# Patient Record
Sex: Male | Born: 1960 | Race: White | Hispanic: No | Marital: Married | Smoking: Never smoker
Health system: Southern US, Community
[De-identification: ages and names within clinical notes are randomized; demographics above are authoritative.]

## PROBLEM LIST (undated history)

## (undated) DIAGNOSIS — I10 Essential (primary) hypertension: Secondary | ICD-10-CM

## (undated) DIAGNOSIS — K219 Gastro-esophageal reflux disease without esophagitis: Secondary | ICD-10-CM

---

## 2005-06-08 ENCOUNTER — Observation Stay (HOSPITAL_COMMUNITY): Admission: EM | Admit: 2005-06-08 | Discharge: 2005-06-09 | Payer: Self-pay | Admitting: Emergency Medicine

## 2005-06-09 ENCOUNTER — Encounter (INDEPENDENT_AMBULATORY_CARE_PROVIDER_SITE_OTHER): Payer: Self-pay | Admitting: Cardiology

## 2008-02-19 ENCOUNTER — Emergency Department (HOSPITAL_COMMUNITY): Admission: EM | Admit: 2008-02-19 | Discharge: 2008-02-19 | Payer: Self-pay | Admitting: Emergency Medicine

## 2010-09-25 NOTE — H&P (Signed)
NAME:  MURRIEL, EIDEM NO.:  [id number]   MEDICAL RECORD NO.:  [id number]          PATIENT TYPE:  EMS   LOCATION:  MAJO                         FACILITY:  [facility]   PHYSICIAN:  Lonia Blood, M.D.      DATE OF BIRTH:  04/12/1961   DATE OF ADMISSION:  06/08/2005  DATE OF DISCHARGE:                                HISTORY & PHYSICAL   PRIMARY CARE PHYSICIAN:  [facility]   PRESENTING COMPLAINT:  Retrosternal chest pain.   HISTORY OF PRESENT ILLNESS:  Patient is a 50 year old gentleman with history  of GERD and dyslipidemia who apparently started having retrosternal and left-  sided chest pain last night at 9:00.  Pain was said to be severe, up to 8-10  and radiating towards his left and right shoulders.  The chest pain is worse  when he lies down flat and gets better when he leans forward.  He denied any  diaphoresis, nausea, or vomiting.  Patient has been battling severe reflux  and has had sore throat from that.  He was initiated on Nexium last November  which he takes daily for his GERD.  He has described his pain as not  consistent with his previous GERD feelings.  He has changed his diet  recently to low fat diet secondary to his high cholesterol and he also takes  green tea as part of the dietary changes.  Otherwise, denied any prior chest  pain and is not getting worse with exertion.   PAST MEDICAL HISTORY:  Significant only for GERD which is severe and  dyslipidemia.   ALLERGIES:  Patient has no known drug allergies.   MEDICATIONS:  Nexium 40 mg daily.   SOCIAL HISTORY:  Patient is married with four children, most of them grown  up.  He denied any tobacco use.  He drinks occasionally.  This past Saturday  he drank seven beers, but only drinks socially.  No IV drug use.   FAMILY HISTORY:  His father died from complications of Parkinson's disease.  Mother is alive but she has had mild stroke.  She also has some borderline  diabetes and  hypertension.  Patient's aunt died from acute MI in her 37s.  His uncle also had multiple cardiac surgeries including CABG.  His children,  however, are healthy except for multiple fractures from playing sports.   REVIEW OF SYSTEMS:  A 10-point review of systems is performed and is  essentially as in HPI.   PHYSICAL EXAMINATION:  VITAL SIGNS:  Temperature 98, blood pressure 181/105  initially, currently down to 153/97.  His pulse is 82.  Respiratory rate is  20.  Saturations 97% on room air.  GENERAL:  Patient is pleasant, alert and oriented, in no acute distress.  HEENT:  PERRL.  EOMI.  NECK:  Supple.  No JVD.  No lymphadenopathy.  RESPIRATORY:  Patient has good air entry bilaterally.  CARDIOVASCULAR:  He has regular rate and rhythm.  ABDOMEN:  Soft, nontender with positive bowel sounds.  Obese.  EXTREMITIES:  No edema, cyanosis, or clubbing.   LABORATORIES:  His current laboratories include initial cardiac enzymes that  are negative.  Other laboratory work is still pending.  His EKG showed  normal sinus rhythm with a rate of 80, normal intervals, normal axis, no ST-  T wave changes.  Chest x-ray essentially negative for acute disease.  Chest  CT without contrast is also performed that shows negative for pulmonary  embolus.  Also negative for aortic dissection.   ASSESSMENT:  This is a 50 year old gentleman with mainly history of severe  gastroesophageal reflux disease presenting with retrosternal chest pain.  Patient has risk factors for cardiac disease including high cholesterol,  current hypertension, although patient has no baseline history of  hypertension, being a male and then some family history.  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  The differentials have  already been ruled out for the most part.  We, however, need to rule out  cardiac disease.  My impression is that  his chest pain is more likely to be  secondary to his severe gastroesophageal reflux disease, especially since it  gets worse when he lies down flat and patient said he has been having sore  throat which is attributed to his gastroesophageal reflux disease.  He is  taking Nexium but he said he been having attacks and he has to take extra  Zantac.  He might require PPIs twice a day maybe to control his symptoms.  In the meantime, however, we need to rule out pericarditis through 2-D  echocardiogram and also continue to check serial cardiac enzymes.  If they  are all negative patient may still require some stress test.  1.  Severe gastroesophageal reflux disease.  Patient has had      gastroesophageal reflux disease for more than 20 years.  He had an EGD      about 15-20 years ago and since then has not had any.  He would benefit      from another EGD.  I will refer patient to Dr. Loreta Ave and Dr. Elnoria Howard so he      could get that done.  2.  Morbid obesity.  Patient is currently trying on his own to lose weight.      I will encourage him to do that.  3.  Dyslipidemia.  Will check fasting lipid panel.  4.  Hypertension.  His blood pressure is currently elevated in addition to      the chest pain, therefore, I will start him on some beta blocker.  I      will start him on some Lopressor.  Give him some aspirin, low dose.      Because of his gastroesophageal reflux disease I will give him enteric-      coated 81 mg.  I will also give him some nitroglycerin also sublingual      and observe patient overnight.  5.  Prophylaxis.  Patient is already going to be on PPI b.i.d. and will add      Lovenox for deep venous thrombosis prophylaxis.  If his enzymes start      rising then we will change that to full dose Lovenox.      Lonia Blood, M.D.  Electronically Signed     LG/MEDQ  D:  06/08/2005  T:  06/08/2005  Job:  [id number]

## 2010-09-25 NOTE — Discharge Summary (Signed)
NAME:  RENTON, BERKLEY NO.:  [id number]   MEDICAL RECORD NO.:  [id number]          PATIENT TYPE:  OBV   LOCATION:  4706                         FACILITY:  [facility]   PHYSICIAN:  Lonia Blood, M.D.      DATE OF BIRTH:  08-Dec-1960   DATE OF ADMISSION:  06/08/2005  DATE OF DISCHARGE:  06/09/2005                                 DISCHARGE SUMMARY   PRIMARY CARE PHYSICIAN:  [facility].   DISCHARGE DIAGNOSES:  1.  Atypical chest pain.  2.  Severe gastroesophageal reflux disease.  3.  Obesity.  4.  Dyslipidemia.  5.  High blood pressure with no prior history of hypertension.   DISCHARGE MEDICATIONS:  1.  Nexium 40 mg b.i.d.  2.  Toprol XL 25 mg daily.   DISPOSITION:  The patient was to follow up with Dr. Loreta Ave one week after  discharge, as well as the [facility] about a week later.  He also had an appointment to follow up February 2, at [facility] for Cardiol[facility].  His appointment was at 9:45 a.m.   PROCEDURES:  Chest x-ray performed on June 08, 2005.  Chest x-ray shows  no acute disease.  Chest CT follow up was negative for PE, negative for  aortic dissection also.  CT angiography of the abdomen also showed no aortic  dissection.  A 2D echocardiogram on June 08, 2005, showed an EF of 60-70%  and virtually normal.   CONSULTATIONS:  [facility].   HISTORY OF PRESENT ILLNESS:  The patient is a 50 year old gentleman with  history of severe reflux disease for more than 20 years who recently started  PPI after staying off medications for a long time.  He came in secondary to  severe left sided chest pain.  It was worse when he was lying flat.  The  patient said it is also worse when he takes in a deep breath.  In the ED, he  was found to be hypertensive with blood pressure as high as 180 systolic.  He was alert and non-hypertensive.  He had history of dyslipidemia as well  as  obesity, but denied any tobacco use.  He has risk factors for heart  disease in his family.  We subsequently admitted him for rule out MI.   HOSPITAL COURSE:  #1 - CHEST PAIN.  The patient's chest pain actually  disappeared with combination of increased PPI, nitroglycerin, Lopressor.  Will also check a 2D echo as indicated above which is negative.  It appeared  as if his chest pain was more likely secondary to reflux.  However, due to  his risk factors, we got Cardiology involved.  With negative enzymes, he was  scheduled for outpatient exercise stress test.   #2 - HYPERTENSION.  This seems to be new diagnosis for the patient.  We  subsequently started him on Lopressor, and he was discharged home on Toprol  XL.  His blood pressure as well controlled afterwards.   #3 - DYSLIPIDEMIA.  The patient gave history of  dyslipidemia.  However, he  denied taking any medications prior to arrival in the hospital.  His lipid  panel here was basically within normal limits.  We did not start him on any  medication as well.  Subsequently, he was discharged.  Continue with follow  up as an outpatient.   #4 - OBESITY.  The patient was also counseled on need for him to loose  weight for both his GERD as well as his heart.  Subsequently, he was  discharged and to follow up with Dr. Loreta Ave for his GERD.  His PPI dose was  increased to twice a day rather than once a day.      Lonia Blood, M.D.  Electronically Signed     LG/MEDQ  D:  09/21/2005  T:  09/22/2005  Job:  [id number]

## 2011-02-08 LAB — DIFFERENTIAL
Eosinophils Absolute: 0.1
Eosinophils Relative: 2
Lymphocytes Relative: 35
Lymphs Abs: 2.5
Monocytes Relative: 13 — ABNORMAL HIGH
Neutro Abs: 3.6

## 2011-02-08 LAB — POCT I-STAT, CHEM 8
BUN: 12
Creatinine, Ser: 1.3
Glucose, Bld: 94
HCT: 46
Hemoglobin: 15.6
TCO2: 30

## 2011-02-08 LAB — CBC
Hemoglobin: 15.8
MCHC: 34.7
Platelets: 234
RBC: 4.88
WBC: 7.2

## 2011-02-08 LAB — PROTIME-INR
INR: 1
Prothrombin Time: 13

## 2015-11-20 DIAGNOSIS — I10 Essential (primary) hypertension: Secondary | ICD-10-CM | POA: Insufficient documentation

## 2015-11-20 DIAGNOSIS — G4700 Insomnia, unspecified: Secondary | ICD-10-CM | POA: Insufficient documentation

## 2015-11-20 DIAGNOSIS — K219 Gastro-esophageal reflux disease without esophagitis: Secondary | ICD-10-CM | POA: Insufficient documentation

## 2015-11-20 DIAGNOSIS — E785 Hyperlipidemia, unspecified: Secondary | ICD-10-CM | POA: Insufficient documentation

## 2019-03-27 ENCOUNTER — Emergency Department (HOSPITAL_COMMUNITY): Payer: BC Managed Care – PPO

## 2019-03-27 ENCOUNTER — Emergency Department (HOSPITAL_COMMUNITY)
Admission: EM | Admit: 2019-03-27 | Discharge: 2019-03-28 | Disposition: A | Discharge status: Home / Self Care | Payer: BC Managed Care – PPO | Attending: Emergency Medicine | Admitting: Emergency Medicine

## 2019-03-27 ENCOUNTER — Other Ambulatory Visit: Payer: Self-pay

## 2019-03-27 ENCOUNTER — Encounter (HOSPITAL_COMMUNITY): Payer: Self-pay

## 2019-03-27 DIAGNOSIS — R0789 Other chest pain: Secondary | ICD-10-CM | POA: Insufficient documentation

## 2019-03-27 DIAGNOSIS — R079 Chest pain, unspecified: Secondary | ICD-10-CM

## 2019-03-27 DIAGNOSIS — E876 Hypokalemia: Secondary | ICD-10-CM | POA: Diagnosis not present

## 2019-03-27 DIAGNOSIS — R202 Paresthesia of skin: Secondary | ICD-10-CM | POA: Insufficient documentation

## 2019-03-27 DIAGNOSIS — R200 Anesthesia of skin: Secondary | ICD-10-CM | POA: Diagnosis not present

## 2019-03-27 DIAGNOSIS — I10 Essential (primary) hypertension: Secondary | ICD-10-CM | POA: Insufficient documentation

## 2019-03-27 HISTORY — DX: Essential (primary) hypertension: I10

## 2019-03-27 HISTORY — DX: Gastro-esophageal reflux disease without esophagitis: K21.9

## 2019-03-27 LAB — COMPREHENSIVE METABOLIC PANEL
ALT: 29 U/L (ref 0–44)
AST: 23 U/L (ref 15–41)
Albumin: 3.7 g/dL (ref 3.5–5.0)
Alkaline Phosphatase: 68 U/L (ref 38–126)
Anion gap: 7 (ref 5–15)
BUN: 15 mg/dL (ref 6–20)
CO2: 26 mmol/L (ref 22–32)
Calcium: 8.7 mg/dL — ABNORMAL LOW (ref 8.9–10.3)
Chloride: 105 mmol/L (ref 98–111)
Creatinine, Ser: 1.24 mg/dL (ref 0.61–1.24)
GFR calc Af Amer: >60 mL/min (ref >60)
GFR calc non Af Amer: >60 mL/min (ref >60)
Glucose, Bld: 152 mg/dL — ABNORMAL HIGH (ref 70–99)
Potassium: 3.2 mmol/L — ABNORMAL LOW (ref 3.5–5.1)
Sodium: 138 mmol/L (ref 135–145)
Total Bilirubin: 0.4 mg/dL (ref 0.3–1.2)
Total Protein: 6.8 g/dL (ref 6.5–8.1)

## 2019-03-27 LAB — DIFFERENTIAL
Abs Immature Granulocytes: 0.02 10*3/uL (ref 0.00–0.07)
Basophils Absolute: 0 10*3/uL (ref 0.0–0.1)
Basophils Relative: 1 %
Eosinophils Absolute: 0.2 10*3/uL (ref 0.0–0.5)
Eosinophils Relative: 2 %
Immature Granulocytes: 0 %
Lymphocytes Relative: 34 %
Lymphs Abs: 2.7 10*3/uL (ref 0.7–4.0)
Monocytes Absolute: 1 10*3/uL (ref 0.1–1.0)
Monocytes Relative: 12 %
Neutro Abs: 4 10*3/uL (ref 1.7–7.7)
Neutrophils Relative %: 51 %

## 2019-03-27 LAB — APTT: aPTT: 31 seconds (ref 24–36)

## 2019-03-27 LAB — CBC
HCT: 42.4 % (ref 39.0–52.0)
Hemoglobin: 14.7 g/dL (ref 13.0–17.0)
MCH: 31.8 pg (ref 26.0–34.0)
MCHC: 34.7 g/dL (ref 30.0–36.0)
MCV: 91.8 fL (ref 80.0–100.0)
Platelets: 209 10*3/uL (ref 150–400)
RBC: 4.62 MIL/uL (ref 4.22–5.81)
RDW: 12.5 % (ref 11.5–15.5)
WBC: 7.8 10*3/uL (ref 4.0–10.5)
nRBC: 0 % (ref 0.0–0.2)

## 2019-03-27 LAB — PROTIME-INR
INR: 1 (ref 0.8–1.2)
Prothrombin Time: 13.5 seconds (ref 11.4–15.2)

## 2019-03-27 NOTE — ED Triage Notes (Signed)
Pt reports left sided chest pain that started about 1 hr ago, pain has improved since when it started. Pt also c.o left sided facial numbness that radiates halfway down his arm. Pt denies any weakness, no neuro deficits noted. Pt a.o, nad noted.

## 2019-03-28 ENCOUNTER — Encounter (HOSPITAL_COMMUNITY): Payer: Self-pay | Admitting: Emergency Medicine

## 2019-03-28 LAB — TROPONIN I (HIGH SENSITIVITY)
Troponin I (High Sensitivity): 2 ng/L (ref <18)
Troponin I (High Sensitivity): 3 ng/L (ref <18)

## 2019-03-28 MED ORDER — POTASSIUM CHLORIDE CRYS ER 20 MEQ PO TBCR: 40.0000 meq | EXTENDED_RELEASE_TABLET | Freq: Every day | ORAL | 0 refills | Status: DC

## 2019-03-28 MED ORDER — POTASSIUM CHLORIDE CRYS ER 20 MEQ PO TBCR
40.0000 meq | EXTENDED_RELEASE_TABLET | Freq: Once | ORAL | Status: AC
  Administered 2019-03-28: 40 meq via ORAL
  Filled 2019-03-28: qty 2

## 2019-03-28 MED ORDER — ACETAMINOPHEN 325 MG PO TABS
650.0000 mg | ORAL_TABLET | Freq: Once | ORAL | Status: AC | PRN
  Administered 2019-03-28: 650 mg via ORAL
  Filled 2019-03-28: qty 2

## 2019-03-28 NOTE — ED Notes (Signed)
ED Provider at bedside. 

## 2019-03-28 NOTE — ED Notes (Signed)
Pt says that his blood pressure spiked and he took his blood pressure, said that he was having some chest pain and numbness that has since subsided. He is most concerned at this time about his blood pressure. Alert and oriented, no neuro deficits.

## 2019-03-28 NOTE — ED Provider Notes (Signed)
[facility] EMERGENCY DEPARTMENT Provider Note   CSN: [id number] Arrival date & time: 03/27/19  2056     History   Chief Complaint Chief Complaint  Patient presents with   Chest Pain   Numbness    HPI Ruben Simmons is a 58 y.o. male.     Patient with history of HTN presents with onset of left sided chest and neck pain about 1 hours prior to arrival in the ED. No SOB, cough, fever, diaphoresis. He denies history of ischemia. No nausea or vomiting. No modifying factors. The discomfort has been constant since arrival. He reports his chest discomfort extends to the left neck and face and that his face felt somewhat numb earlier. The numbness has now resolved. No congestion, sore throat or ear pain.   The history is provided by the patient. No language interpreter was used.  Chest Pain Associated symptoms: numbness (See HPI.)   Associated symptoms: no abdominal pain, no cough, no diaphoresis, no fever, no nausea and no shortness of breath     Past Medical History:  Diagnosis Date   GERD (gastroesophageal reflux disease)    Hypertension     There are no active problems to display for this patient.   History reviewed. No pertinent surgical history.      Home Medications    Prior to Admission medications   Not on File    Family History No family history on file.  Social History Social History   Tobacco Use   Smoking status: Not on file  Substance Use Topics   Alcohol use: Not on file   Drug use: Not on file     Allergies   Penicillins   Review of Systems Review of Systems  Constitutional: Negative for chills, diaphoresis and fever.  HENT: Negative.  Negative for congestion, ear pain, facial swelling and sore throat.   Respiratory: Negative.  Negative for cough and shortness of breath.   Cardiovascular: Positive for chest pain.  Gastrointestinal: Negative.  Negative for abdominal pain and nausea.  Musculoskeletal: Negative.     Skin: Negative.   Neurological: Positive for numbness (See HPI.).     Physical Exam Updated Vital Signs BP (!) 132/97 (BP Location: Left Arm)    Pulse (!) 54    Temp 97.8 F (36.6 C) (Oral)    Resp 16    Ht 5\' 11"  (1.803 m)    Wt 105.2 kg    SpO2 98%    BMI 32.36 kg/m   Physical Exam Vitals signs and nursing note reviewed.  Constitutional:      Appearance: He is well-developed.  HENT:     Head: Normocephalic.  Neck:     Musculoskeletal: Normal range of motion and neck supple.     Vascular: No carotid bruit.  Cardiovascular:     Rate and Rhythm: Normal rate and regular rhythm.     Heart sounds: No murmur.  Pulmonary:     Effort: Pulmonary effort is normal.     Breath sounds: Normal breath sounds. No wheezing, rhonchi or rales.  Chest:     Chest wall: No tenderness.  Abdominal:     General: Bowel sounds are normal.     Palpations: Abdomen is soft.     Tenderness: There is no abdominal tenderness. There is no guarding or rebound.  Musculoskeletal: Normal range of motion.  Skin:    General: Skin is warm and dry.     Findings: No rash.  Neurological:  General: No focal deficit present.     Mental Status: He is alert and oriented to person, place, and time.     Sensory: No sensory deficit.     Motor: No weakness.     Coordination: Coordination normal.     Gait: Gait normal.      ED Treatments / Results  Labs (all labs ordered are listed, but only abnormal results are displayed) Labs Reviewed  COMPREHENSIVE METABOLIC PANEL - Abnormal; Notable for the following components:      Result Value   Potassium 3.2 (*)    Glucose, Bld 152 (*)    Calcium 8.7 (*)    All other components within normal limits  PROTIME-INR  APTT  CBC  DIFFERENTIAL  TROPONIN I (HIGH SENSITIVITY)  TROPONIN I (HIGH SENSITIVITY)    EKG EKG Interpretation  Date/Time:  Tuesday March 27 2019 21:23:52 EST Ventricular Rate:  69 PR Interval:  154 QRS Duration: 106 QT  Interval:  386 QTC Calculation: 413 R Axis:   29 Text Interpretation: Normal sinus rhythm Incomplete right bundle branch block Borderline ECG No significant change was found Confirmed by Drema Pryardama, Pedro [phone]([id number]) on 03/28/2019 4:36:20 AM   Radiology Dg Chest 2 View  Result Date: 03/27/2019 CLINICAL DATA:  Chest pain. EXAM: CHEST - 2 VIEW COMPARISON:  Chest radiograph 02/20/2008 FINDINGS: Heart size at the upper limits of normal. Mild ill-defined opacities of bilateral lung bases have an appearance most consistent with atelectasis. No evidence of pneumothorax or sizable pleural effusion. No acute bony abnormality. IMPRESSION: Heart size at the upper limits of normal. Mild ill-defined opacities at the bilateral lung bases with an appearance most consistent with atelectasis. Electronically Signed   By: Jackey LogeKyle  Golden DO   On: 03/27/2019 21:53   Ct Head Wo Contrast  Result Date: 03/27/2019 CLINICAL DATA:  Focal neuro deficit, greater than 6 hours, stroke suspected. Additional provided: Patient reports numbness and tingling on left side of face for 1.5 hours. Chest pain. EXAM: CT HEAD WITHOUT CONTRAST TECHNIQUE: Contiguous axial images were obtained from the base of the skull through the vertex without intravenous contrast. COMPARISON:  Head CT 02/20/2008 FINDINGS: Brain: No evidence of acute intracranial hemorrhage. No acute demarcated cortical infarction. 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 No evidence of intracranial mass. No midline shift or extra-axial fluid collection. Cerebral volume is normal for age. Vascular: No hyperdense vessel Skull: Normal. Negative for fracture or focal lesion. Sinuses/Orbits: Visualized orbits demonstrate no acute abnormality. No significant paranasal sinus disease or mastoid effusion at the imaged levels. IMPRESSION: 1. No CT evidence of acute intracranial abnormality. 2. 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 Electronically Signed   By: Jackey LogeKyle  Golden DO   On: 03/27/2019 21:59    Procedures Procedures (including critical care time)  Medications Ordered in ED Medications  acetaminophen (TYLENOL) tablet 650 mg (650 mg Oral Given 03/28/19 0035)     Initial Impression / Assessment and Plan / ED Course  I have reviewed the triage vital signs and the nursing notes.  Pertinent labs & imaging results that were available during my care of the patient were reviewed by me and considered in my medical decision making (see chart for details).        Patient to ED with left chest pain extending to left neck and face, including brief episode of facial numbness.   Chest pain is  atypical in that it has been constant. Trop x 2 negative, nonischemic EKG, clear CXR, no significant lab abnormalities with the exception of a mild hypokalemia, which will be supplemented.   He is felt appropriate for discharge home and is encouraged to follow up with PCP for recheck.   Final Clinical Impressions(s) / ED Diagnoses   Final diagnoses:  None   1. Nonspecific chest pain 2. Hypokalemia  ED Discharge Orders    None       Elpidio Anis, PA-C 03/28/19 [id number]    Nira Conn, MD 03/28/19 [phone]

## 2019-03-28 NOTE — Discharge Instructions (Addendum)
Please follow up with your doctor to let them know of your symptoms and evaluation in the emergency department tonight. Return to the ED with any severe pain, severe shortness of breath, high fever, or new concerns.  Take potassium supplements once daily for an additional 2 days.

## 2020-10-13 DIAGNOSIS — U071 COVID-19: Secondary | ICD-10-CM | POA: Diagnosis not present

## 2020-10-14 DIAGNOSIS — U071 COVID-19: Secondary | ICD-10-CM | POA: Diagnosis not present

## 2020-11-04 DIAGNOSIS — R739 Hyperglycemia, unspecified: Secondary | ICD-10-CM | POA: Diagnosis not present

## 2020-11-04 DIAGNOSIS — K146 Glossodynia: Secondary | ICD-10-CM | POA: Diagnosis not present

## 2020-11-04 DIAGNOSIS — Z125 Encounter for screening for malignant neoplasm of prostate: Secondary | ICD-10-CM | POA: Diagnosis not present

## 2020-11-04 DIAGNOSIS — I10 Essential (primary) hypertension: Secondary | ICD-10-CM | POA: Diagnosis not present

## 2020-11-14 ENCOUNTER — Encounter (HOSPITAL_COMMUNITY): Payer: Self-pay

## 2020-11-14 ENCOUNTER — Emergency Department (HOSPITAL_COMMUNITY)
Admission: EM | Admit: 2020-11-14 | Discharge: 2020-11-14 | Discharge status: Against Medical Advice | Payer: BC Managed Care – PPO | Attending: Emergency Medicine | Admitting: Emergency Medicine

## 2020-11-14 ENCOUNTER — Other Ambulatory Visit: Payer: Self-pay

## 2020-11-14 DIAGNOSIS — T466X5A Adverse effect of antihyperlipidemic and antiarteriosclerotic drugs, initial encounter: Secondary | ICD-10-CM | POA: Diagnosis not present

## 2020-11-14 DIAGNOSIS — Z5321 Procedure and treatment not carried out due to patient leaving prior to being seen by health care provider: Secondary | ICD-10-CM | POA: Diagnosis not present

## 2020-11-14 DIAGNOSIS — R030 Elevated blood-pressure reading, without diagnosis of hypertension: Secondary | ICD-10-CM | POA: Diagnosis not present

## 2020-11-14 DIAGNOSIS — I10 Essential (primary) hypertension: Secondary | ICD-10-CM

## 2020-11-14 DIAGNOSIS — M791 Myalgia, unspecified site: Secondary | ICD-10-CM | POA: Insufficient documentation

## 2020-11-14 DIAGNOSIS — I161 Hypertensive emergency: Secondary | ICD-10-CM | POA: Diagnosis not present

## 2020-11-14 NOTE — ED Notes (Signed)
I called patient to take back to a room and no one responded 

## 2020-11-14 NOTE — ED Triage Notes (Signed)
Pt reports high blood pressure this morning after waking up. Sts it usually runs in the 130's/70's. Seen recently for lisinopril adjustments.

## 2020-11-14 NOTE — ED Notes (Signed)
Pt not in the lobby when updating vitals.

## 2020-11-19 ENCOUNTER — Telehealth: Payer: Self-pay | Admitting: Cardiovascular Disease

## 2020-11-19 NOTE — Telephone Encounter (Signed)
Received message from Dr. Elease Hashimoto about getting pt in for an appt due to HTN.  Called pt and scheduled him to see Dr. Elease Hashimoto 7/18.  Pt appreciative for call.

## 2020-11-21 DIAGNOSIS — R739 Hyperglycemia, unspecified: Secondary | ICD-10-CM | POA: Diagnosis not present

## 2020-11-21 DIAGNOSIS — K146 Glossodynia: Secondary | ICD-10-CM | POA: Diagnosis not present

## 2020-11-21 DIAGNOSIS — Z125 Encounter for screening for malignant neoplasm of prostate: Secondary | ICD-10-CM | POA: Diagnosis not present

## 2020-11-21 DIAGNOSIS — I10 Essential (primary) hypertension: Secondary | ICD-10-CM | POA: Diagnosis not present

## 2020-11-22 NOTE — Progress Notes (Signed)
Cardiology Office Note:    Date:  11/24/2020   ID:  Ruben Simmons, DOB [birth year], MRN [id number]  PCP:  [facility]   [facility] Providers Cardiologist:  Kapil Petropoulos  Click to update primary MD,subspecialty MD or APP then REFRESH:1}    Referring MD: Roe Coombs*   Chief Complaint  Patient presents with   Hypertension    November 24, 2020:    Ruben Simmons is a 60 y.o. male with a hx of HTN.  We were asked to see him by Dr. Doristine Counter for further evalutaion of his HTN  Ruben Simmons is seen today for further management of his hypertension. 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 Just sold his [employer] ,  has gone back to work  [employer] his company this past Dec.   No regular exercise,  Eats out every day for lunch ,  Goes out for dinner frequently .   Labs from his primary medical doctor's office reveals relatively normal CBC.  His hemoglobin is 14.8.  Platelet count is 230.  White blood cell count is 6.3.  PSA 0.44.  Lipid profile reveals LDL is 139 Triglyceride level is 177 HDL is 33 Total cholesterol is 192  Hemoglobin A1c is 5.9  Complete metabolic profile reveals a potassium of 3.8.  Glucose is 98. Creatinine 1.15    Liver enzymes are within normal limits. Has tried statins but couild not tolerate any of them   Past Medical History:  Diagnosis Date   GERD (gastroesophageal reflux disease)    Hypertension     History reviewed. No pertinent surgical history.  Current Medications: Current Meds  Medication Sig   aspirin 81 MG EC tablet Take 1 tablet by mouth daily.   esomeprazole (NEXIUM) 20 MG capsule Take 40 mg by mouth daily at 12 noon.   hydrochlorothiazide (MICROZIDE) 12.5 MG capsule Take 1 capsule (12.5 mg total) by mouth daily.   losartan (COZAAR) 50 MG tablet Take 1 tablet (50 mg total) by  mouth daily.   potassium chloride (KLOR-CON) 10 MEQ tablet Take 1 tablet (10 mEq total) by mouth daily.   [DISCONTINUED] esomeprazole (NEXIUM) 40 MG capsule Take 1 tablet by mouth daily.   [DISCONTINUED] lisinopril-hydrochlorothiazide (ZESTORETIC) 10-12.5 MG tablet Take 1 tablet by mouth daily.     Allergies:   Dexamethasone, Penicillin g, Penicillins, Statins, and Prednisone   Social History   Socioeconomic History   Marital status: Married    Spouse name: Not on file   Number of children: Not on file   Years of education: Not on file   Highest education level: Not on file  Occupational History   Not on file  Tobacco Use   Smoking status: Never   Smokeless tobacco: Never  Vaping Use   Vaping Use: Never used  Substance and Sexual Activity   Alcohol use: Yes    Comment: Occassionally   Drug use: Not on file   Sexual activity: Not on file  Other Topics Concern   Not on file  Social History Narrative   Not on file   Social Determinants of Health   Financial Resource Strain: Not on file  Food Insecurity: Not on file  Transportation Needs: Not on file  Physical Activity: Not on file  Stress: Not on file  Social Connections: Not on file  Family History: The patient's family history includes Parkinson's disease in his father.  ROS:   Please see the history of present illness.     All other systems reviewed and are negative.  EKGs/Labs/Other Studies Reviewed:    The following studies were reviewed today:   EKG:  E  November 24, 2020;  NSR at 28.  No ST or T wave change.   Recent Labs: No results found for requested labs within last 8760 hours.  Recent Lipid Panel No results found for: CHOL, TRIG, HDL, CHOLHDL, VLDL, LDLCALC, LDLDIRECT   Risk Assessment/Calculations:           Physical Exam:    VS:  BP (!) 146/90   Pulse 74   Ht 5\' 11"  (1.803 m)   Wt 245 lb 12.8 oz (111.5 kg)   SpO2 97%   BMI 34.28 kg/m     Wt Readings from Last 3 Encounters:   11/24/20 245 lb 12.8 oz (111.5 kg)  11/14/20 240 lb (108.9 kg)  03/27/19 232 lb (105.2 kg)     GEN:  middle age, moderately obese  HEENT: Normal NECK: No JVD; No carotid bruits LYMPHATICS: No lymphadenopathy CARDIAC: RRR, no murmurs, rubs, gallops RESPIRATORY:  Clear to auscultation without rales, wheezing or rhonchi  ABDOMEN: Soft, non-tender, non-distended MUSCULOSKELETAL:  No edema; No deformity  SKIN: Warm and dry NEUROLOGIC:  Alert and oriented x 3 PSYCHIATRIC:  Normal affect   ASSESSMENT:    1. Primary hypertension   2. Mixed hyperlipidemia    PLAN:    In order of problems listed above:  Hypertension: Ruben Simmons presents for further evaluation of hypertension and his variable blood pressure.  He admits to eating a very poor diet.  Eats lots of restaurant food.  Because of the sale of his business this past December he has been working quite a bit more and has not had time to exercise.  He is picked up a little bit of weight.  I strongly encouraged him to work on a diet, exercise, weight loss program.  He needs to cut the excess salt out of his diet. We will discontinue the lisinopril HCTZ.  We will start him on losartan 50 mg a day, HCTZ 12.5 mg a day.  Potassium chloride 10 mill equivalents a day.  Basic metabolic profile in 3 weeks.  I will see him again in 3 months for follow-up office visit.  2.  Hyperlipidemia: He is intolerant to statins.  He will work on a diet, exercise, weight loss program.  His LDL is a little bit high.  In 3 months we will plan on measuring his lipid profile.  Anticipate referring him to the lipid clinic for consideration of a PCSK9 inhibitor or perhaps Zetia.  We will also discussed doing a coronary calcium score.        Medication Adjustments/Labs and Tests Ordered: Current medicines are reviewed at length with the patient today.  Concerns regarding medicines are outlined above.  Orders Placed This Encounter  Procedures   Basic metabolic  panel   EKG 12-Lead    Meds ordered this encounter  Medications   losartan (COZAAR) 50 MG tablet    Sig: Take 1 tablet (50 mg total) by mouth daily.    Dispense:  90 tablet    Refill:  3   hydrochlorothiazide (MICROZIDE) 12.5 MG capsule    Sig: Take 1 capsule (12.5 mg total) by mouth daily.    Dispense:  90 capsule    Refill:  3   potassium chloride (KLOR-CON) 10 MEQ tablet    Sig: Take 1 tablet (10 mEq total) by mouth daily.    Dispense:  90 tablet    Refill:  3      Patient Instructions  Medication Instructions:  Your physician has recommended you make the following change in your medication:   Stop Lisinopril/HCTZ Begin Losartan, 50mg  tablet, once daily Begin HCTZ 12.5mg  capsule, once daily Potassium supplement (K-Dur) 10 mEq tablet, once daily  Labwork: Your physician recommends that you return for lab work in:   BMP in 3 weeks  Testing/Procedures: None ordered.   Follow-Up: Your physician recommends that you schedule a follow-up appointment in:   September 8- Lab work- Come by the office anytime between 8am and 4:30pm. You do not need to be fasting.  Tuesday October 25 @ 3:40pm with Dr. October 27    Any Other Special Instructions Will Be Listed Below (If Applicable).     If you need a refill on your cardiac medications before your next appointment, please call your pharmacy.   Signed, Elease Hashimoto, MD  11/24/2020 5:22 PM    [facility]

## 2020-11-24 ENCOUNTER — Other Ambulatory Visit: Payer: Self-pay

## 2020-11-24 ENCOUNTER — Encounter: Payer: Self-pay | Admitting: Cardiovascular Disease

## 2020-11-24 ENCOUNTER — Ambulatory Visit: Payer: BC Managed Care – PPO | Admitting: Cardiovascular Disease

## 2020-11-24 VITALS — BP 146/90 | HR 74 | Ht 71.0 in | Wt 245.8 lb

## 2020-11-24 DIAGNOSIS — E782 Mixed hyperlipidemia: Secondary | ICD-10-CM | POA: Diagnosis not present

## 2020-11-24 DIAGNOSIS — I10 Essential (primary) hypertension: Secondary | ICD-10-CM

## 2020-11-24 MED ORDER — POTASSIUM CHLORIDE ER 10 MEQ PO TBCR: 10.0000 meq | EXTENDED_RELEASE_TABLET | Freq: Every day | ORAL | 3 refills | Status: DC

## 2020-11-24 MED ORDER — HYDROCHLOROTHIAZIDE 12.5 MG PO CAPS: 12.5000 mg | ORAL_CAPSULE | Freq: Every day | ORAL | 3 refills | Status: DC

## 2020-11-24 MED ORDER — LOSARTAN POTASSIUM 50 MG PO TABS: 50.0000 mg | ORAL_TABLET | Freq: Every day | ORAL | 3 refills | Status: DC

## 2020-11-24 NOTE — Patient Instructions (Addendum)
Medication Instructions:  Your physician has recommended you make the following change in your medication:   Stop Lisinopril/HCTZ Begin Losartan, 50mg  tablet, once daily Begin HCTZ 12.5mg  capsule, once daily Potassium supplement (K-Dur) 10 mEq tablet, once daily  Labwork: Your physician recommends that you return for lab work in:   BMP in 3 weeks  Testing/Procedures: None ordered.   Follow-Up: Your physician recommends that you schedule a follow-up appointment in:   September 8- Lab work- Come by the office anytime between 8am and 4:30pm. You do not need to be fasting.  Tuesday October 25 @ 3:40pm with Dr. October 27    Any Other Special Instructions Will Be Listed Below (If Applicable).     If you need a refill on your cardiac medications before your next appointment, please call your pharmacy.

## 2020-12-15 ENCOUNTER — Other Ambulatory Visit: Payer: BC Managed Care – PPO

## 2020-12-16 ENCOUNTER — Other Ambulatory Visit: Payer: Self-pay

## 2020-12-16 ENCOUNTER — Other Ambulatory Visit: Payer: BC Managed Care – PPO

## 2020-12-16 DIAGNOSIS — I10 Essential (primary) hypertension: Secondary | ICD-10-CM | POA: Diagnosis not present

## 2020-12-16 DIAGNOSIS — E782 Mixed hyperlipidemia: Secondary | ICD-10-CM | POA: Diagnosis not present

## 2020-12-16 LAB — BASIC METABOLIC PANEL
BUN/Creatinine Ratio: 11 (ref 10–24)
BUN: 13 mg/dL (ref 8–27)
CO2: 21 mmol/L (ref 20–29)
Calcium: 8.8 mg/dL (ref 8.6–10.2)
Chloride: 103 mmol/L (ref 96–106)
Creatinine, Ser: 1.21 mg/dL (ref 0.76–1.27)
Glucose: 106 mg/dL — ABNORMAL HIGH (ref 65–99)
Potassium: 3.8 mmol/L (ref 3.5–5.2)
Sodium: 143 mmol/L (ref 134–144)
eGFR: 68 mL/min/{1.73_m2} (ref >59)

## 2020-12-24 ENCOUNTER — Ambulatory Visit: Payer: BC Managed Care – PPO | Admitting: Cardiology

## 2021-02-26 DIAGNOSIS — J0140 Acute pansinusitis, unspecified: Secondary | ICD-10-CM | POA: Diagnosis not present

## 2021-03-03 ENCOUNTER — Other Ambulatory Visit: Payer: Self-pay

## 2021-03-03 ENCOUNTER — Encounter: Payer: Self-pay | Admitting: Cardiovascular Disease

## 2021-03-03 ENCOUNTER — Ambulatory Visit: Payer: BC Managed Care – PPO | Admitting: Cardiovascular Disease

## 2021-03-03 VITALS — BP 136/88 | HR 63 | Ht 71.0 in | Wt 242.6 lb

## 2021-03-03 DIAGNOSIS — E782 Mixed hyperlipidemia: Secondary | ICD-10-CM

## 2021-03-03 DIAGNOSIS — I10 Essential (primary) hypertension: Secondary | ICD-10-CM | POA: Diagnosis not present

## 2021-03-03 NOTE — Progress Notes (Signed)
Cardiology Office Note:    Date:  03/05/2021   ID:  Ruben Simmons, DOB 1961-04-03, MRN [id number]  PCP:  [facility]   [facility] Providers Cardiologist:  Rik Wadel  Click to update primary MD,subspecialty MD or APP then REFRESH:1}    Referring MD: Roe Coombs*   Chief Complaint  Patient presents with   Hypertension         November 24, 2020:    Ruben Simmons is a 60 y.o. male with a hx of HTN.  We were asked to see him by Dr. Doristine Counter for further evalutaion of his HTN  Josede is seen today for further management of his hypertension. 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 Just sold his [employer] ,  has gone back to work  [employer] his company this past Dec.   No regular exercise,  Eats out every day for lunch ,  Goes out for dinner frequently .   Labs from his primary medical doctor's office reveals relatively normal CBC.  His hemoglobin is 14.8.  Platelet count is 230.  White blood cell count is 6.3.  PSA 0.44.  Lipid profile reveals LDL is 139 Triglyceride level is 177 HDL is 33 Total cholesterol is 192  Hemoglobin A1c is 5.9  Complete metabolic profile reveals a potassium of 3.8.  Glucose is 98. Creatinine 1.15    Liver enzymes are within normal limits. Has tried statins but couild not tolerate any of them   Oct. 25, 2022: Ruben Simmons is seen today for follow up of his HTN, HLD    Bp is well controlled  Wt is 242 lbs. ( Down 3 lbs)   We discussed his ASA therapy.  Is intolerent to statins .      Past Medical History:  Diagnosis Date   GERD (gastroesophageal reflux disease)    Hypertension     History reviewed. No pertinent surgical history.  Current Medications: Current Meds  Medication Sig   aspirin 81 MG EC tablet Take 1 tablet by mouth daily.   cefdinir (OMNICEF) 300 MG  capsule Take 300 mg by mouth 2 (two) times daily.   esomeprazole (NEXIUM) 20 MG capsule Take 20 mg by mouth daily at 12 noon.   hydrochlorothiazide (MICROZIDE) 12.5 MG capsule Take 1 capsule (12.5 mg total) by mouth daily.   losartan (COZAAR) 50 MG tablet Take 1 tablet (50 mg total) by mouth daily.   potassium chloride (KLOR-CON) 10 MEQ tablet Take 1 tablet (10 mEq total) by mouth daily.     Allergies:   Dexamethasone, Penicillin g, Penicillins, Statins, and Prednisone   Social History   Socioeconomic History   Marital status: Married    Spouse name: Not on file   Number of children: Not on file   Years of education: Not on file   Highest education level: Not on file  Occupational History   Not on file  Tobacco Use   Smoking status: Never   Smokeless tobacco: Never  Vaping Use   Vaping Use: Never used  Substance and Sexual Activity   Alcohol use: Yes    Comment: Occassionally   Drug use: Not on file   Sexual activity: Not on file  Other Topics Concern   Not on file  Social History Narrative   Not on file   Social  Determinants of Health   Financial Resource Strain: Not on file  Food Insecurity: Not on file  Transportation Needs: Not on file  Physical Activity: Not on file  Stress: Not on file  Social Connections: Not on file     Family History: The patient's family history includes Parkinson's disease in his father.  ROS:   Please see the history of present illness.     All other systems reviewed and are negative.  EKGs/Labs/Other Studies Reviewed:    The following studies were reviewed today:   EKG:     Recent Labs: 12/16/2020: BUN 13; Creatinine, Ser 1.21; Potassium 3.8; Sodium 143  Recent Lipid Panel No results found for: CHOL, TRIG, HDL, CHOLHDL, VLDL, LDLCALC, LDLDIRECT   Risk Assessment/Calculations:           Physical Exam:    Physical Exam: Blood pressure 136/88, pulse 63, height 5\' 11"  (1.803 m), weight 242 lb 9.6 oz (110 kg), SpO2 96  %.  GEN:  Well nourished, well developed in no acute distress HEENT: Normal NECK: No JVD; No carotid bruits LYMPHATICS: No lymphadenopathy CARDIAC: RRR , no murmurs, rubs, gallops RESPIRATORY:  Clear to auscultation without rales, wheezing or rhonchi  ABDOMEN: Soft, non-tender, non-distended MUSCULOSKELETAL:  No edema; No deformity  SKIN: Warm and dry NEUROLOGIC:  Alert and oriented x 3   ASSESSMENT:    1. Primary hypertension   2. Mixed hyperlipidemia     PLAN:     Hypertension:  BP is well controlled.   Basic metabolic profile in 3 weeks.  I will see him again in 3 months for follow-up office visit.  2.  Hyperlipidemia:    He is getting a coronary calcium score Will be better able to assess his risks and also to determin if he needs to take  his ASA      Medication Adjustments/Labs and Tests Ordered: Current medicines are reviewed at length with the patient today.  Concerns regarding medicines are outlined above.  Orders Placed This Encounter  Procedures   CT CARDIAC SCORING (SELF PAY ONLY)     No orders of the defined types were placed in this encounter.     Patient Instructions  Medication Instructions:  Your physician recommends that you continue on your current medications as directed. Please refer to the Current Medication list given to you today.  *If you need a refill on your cardiac medications before your next appointment, please call your pharmacy*   Lab Work: NONE If you have labs (blood work) drawn today and your tests are completely normal, you will receive your results only by: MyChart Message (if you have MyChart) OR A paper copy in the mail If you have any lab test that is abnormal or we need to change your treatment, we will call you to review the results.   Testing/Procedures: Your physician has requested that you have a Coronary Calcium Score.  This is a self pay test at  a cost of $99.   Follow-Up: At [facility], you and  your health needs are our priority.  As part of our continuing mission to provide you with exceptional heart care, we have created designated Provider Care Teams.  These Care Teams include your primary Cardiologist (physician) and Advanced Practice Providers (APPs -  Physician Assistants and Nurse Practitioners) who all work together to provide you with the care you need, when you need it.  We recommend signing up for the patient portal called "MyChart".  Sign up information is  provided on this After Visit Summary.  MyChart is used to connect with patients for Virtual Visits (Telemedicine).  Patients are able to view lab/test results, encounter notes, upcoming appointments, etc.  Non-urgent messages can be sent to your provider as well.   To learn more about what you can do with MyChart, go to [email or web address].    Your next appointment:   6 month(s)  The format for your next appointment:   In Person  Provider:   You may see Kristeen Miss, MD or one of the following Advanced Practice Providers on your designated Care Team:   Tereso Newcomer, PA-C [place], [place]     Signed, Kristeen Miss, MD  03/05/2021 9:49 PM    [facility]

## 2021-03-03 NOTE — Patient Instructions (Signed)
Medication Instructions:  Your physician recommends that you continue on your current medications as directed. Please refer to the Current Medication list given to you today.  *If you need a refill on your cardiac medications before your next appointment, please call your pharmacy*   Lab Work: NONE If you have labs (blood work) drawn today and your tests are completely normal, you will receive your results only by: MyChart Message (if you have MyChart) OR A paper copy in the mail If you have any lab test that is abnormal or we need to change your treatment, we will call you to review the results.   Testing/Procedures: Your physician has requested that you have a Coronary Calcium Score.  This is a self pay test at  a cost of $99.   Follow-Up: At [facility], you and your health needs are our priority.  As part of our continuing mission to provide you with exceptional heart care, we have created designated Provider Care Teams.  These Care Teams include your primary Cardiologist (physician) and Advanced Practice Providers (APPs -  Physician Assistants and Nurse Practitioners) who all work together to provide you with the care you need, when you need it.  We recommend signing up for the patient portal called "MyChart".  Sign up information is provided on this After Visit Summary.  MyChart is used to connect with patients for Virtual Visits (Telemedicine).  Patients are able to view lab/test results, encounter notes, upcoming appointments, etc.  Non-urgent messages can be sent to your provider as well.   To learn more about what you can do with MyChart, go to [email or web address].    Your next appointment:   6 month(s)  The format for your next appointment:   In Person  Provider:   You may see Kristeen Miss, MD or one of the following Advanced Practice Providers on your designated Care Team:   Tereso Newcomer, PA-C [place], [place]

## 2021-04-14 ENCOUNTER — Other Ambulatory Visit: Payer: Self-pay

## 2021-04-14 ENCOUNTER — Ambulatory Visit (INDEPENDENT_AMBULATORY_CARE_PROVIDER_SITE_OTHER)
Admission: RE | Admit: 2021-04-14 | Discharge: 2021-04-14 | Disposition: A | Discharge status: Home / Self Care | Payer: Self-pay | Source: Ambulatory Visit | Attending: Cardiovascular Disease | Admitting: Cardiovascular Disease

## 2021-04-14 DIAGNOSIS — I10 Essential (primary) hypertension: Secondary | ICD-10-CM

## 2021-04-14 DIAGNOSIS — E782 Mixed hyperlipidemia: Secondary | ICD-10-CM

## 2021-04-15 ENCOUNTER — Telehealth: Payer: Self-pay

## 2021-04-15 NOTE — Telephone Encounter (Signed)
-----   Message from Vesta Mixer, MD sent at 04/15/2021 12:56 PM EST ----- Coronary calcium score of 11.3. This was 38th percentile for age-, race-, and sex-matched controls  His CAC score is low but I would like to see if we can reduce his LDL some.  Labs from his primary show LDL of 139 He is intolerant to statins Please start Zetia 10 mg a day  Lipids, ALT in 3 months

## 2021-04-15 NOTE — Telephone Encounter (Signed)
Attempted phone call to pt and left voicemail message to contact triage at [phone]. 

## 2021-04-16 NOTE — Telephone Encounter (Signed)
Pt is returning call to triage, pt can be reached at  [phone]

## 2021-04-16 NOTE — Telephone Encounter (Signed)
Attempted to call the pt back and he did not answer and voicemail was full.

## 2021-04-20 ENCOUNTER — Telehealth: Payer: Self-pay | Admitting: *Deleted

## 2021-04-20 DIAGNOSIS — E782 Mixed hyperlipidemia: Secondary | ICD-10-CM

## 2021-04-20 MED ORDER — EZETIMIBE 10 MG PO TABS: 10.0000 mg | ORAL_TABLET | Freq: Every day | ORAL | 11 refills | Status: DC

## 2021-04-20 NOTE — Telephone Encounter (Signed)
The patient has been notified of the result and verbalized understanding.  All questions (if any) were answered. Sampson Goon, RN 04/20/2021 3:20 PM    Ordered Zetia  Ordered labs and scheduled

## 2021-04-20 NOTE — Telephone Encounter (Signed)
-----   Message from Vesta Mixer, MD sent at 04/15/2021 12:56 PM EST ----- Coronary calcium score of 11.3. This was 38th percentile for age-, race-, and sex-matched controls  His CAC score is low but I would like to see if we can reduce his LDL some.  Labs from his primary show LDL of 139 He is intolerant to statins Please start Zetia 10 mg a day  Lipids, ALT in 3 months

## 2021-04-23 NOTE — Telephone Encounter (Signed)
Sampson Goon, RN  04/20/2021  3:21 PM EST     The patient has been notified of the result and verbalized understanding.  All questions (if any) were answered. Sampson Goon, RN 04/20/2021 3:20 PM    Ordered Zetia  Ordered labs and schedule

## 2021-06-03 DIAGNOSIS — L814 Other melanin hyperpigmentation: Secondary | ICD-10-CM | POA: Diagnosis not present

## 2021-06-03 DIAGNOSIS — D225 Melanocytic nevi of trunk: Secondary | ICD-10-CM | POA: Diagnosis not present

## 2021-06-03 DIAGNOSIS — L570 Actinic keratosis: Secondary | ICD-10-CM | POA: Diagnosis not present

## 2021-06-03 DIAGNOSIS — L728 Other follicular cysts of the skin and subcutaneous tissue: Secondary | ICD-10-CM | POA: Diagnosis not present

## 2021-06-03 DIAGNOSIS — L821 Other seborrheic keratosis: Secondary | ICD-10-CM | POA: Diagnosis not present

## 2021-07-01 DIAGNOSIS — L538 Other specified erythematous conditions: Secondary | ICD-10-CM | POA: Diagnosis not present

## 2021-07-01 DIAGNOSIS — R208 Other disturbances of skin sensation: Secondary | ICD-10-CM | POA: Diagnosis not present

## 2021-07-01 DIAGNOSIS — D492 Neoplasm of unspecified behavior of bone, soft tissue, and skin: Secondary | ICD-10-CM | POA: Diagnosis not present

## 2021-07-01 DIAGNOSIS — C44529 Squamous cell carcinoma of skin of other part of trunk: Secondary | ICD-10-CM | POA: Diagnosis not present

## 2021-07-21 ENCOUNTER — Other Ambulatory Visit: Payer: BC Managed Care – PPO

## 2021-07-21 ENCOUNTER — Other Ambulatory Visit: Payer: Self-pay

## 2021-07-21 DIAGNOSIS — E782 Mixed hyperlipidemia: Secondary | ICD-10-CM

## 2021-07-21 LAB — LIPID PANEL
Chol/HDL Ratio: 6.2 ratio — ABNORMAL HIGH (ref 0.0–5.0)
Cholesterol, Total: 205 mg/dL — ABNORMAL HIGH (ref 100–199)
HDL: 33 mg/dL — ABNORMAL LOW (ref >39)
LDL Chol Calc (NIH): 137 mg/dL — ABNORMAL HIGH (ref 0–99)
Triglycerides: 191 mg/dL — ABNORMAL HIGH (ref 0–149)
VLDL Cholesterol Cal: 35 mg/dL (ref 5–40)

## 2021-07-21 LAB — ALT: ALT: 30 IU/L (ref 0–44)

## 2021-07-22 ENCOUNTER — Telehealth: Payer: Self-pay

## 2021-07-22 DIAGNOSIS — E782 Mixed hyperlipidemia: Secondary | ICD-10-CM

## 2021-07-22 DIAGNOSIS — I10 Essential (primary) hypertension: Secondary | ICD-10-CM

## 2021-07-22 NOTE — Telephone Encounter (Signed)
-----   Message from Thayer Headings, MD sent at 07/22/2021  9:21 AM EDT ----- ?Coronary calcium score of 11.3. This was 38th percentile for age-, ?race-, and sex-matched controls. ?His LDL is 137. On Zetia 10 mg a day  ? ?He does not tolerate statins.  I think he would benefit from consideration of additional medications - will refer to Lipid clinic for consideration of PCSK9i, Inclisiran, or Bempadoic acid  ? ?He needs to work hard on diet, exercise, weight loss ? ? ?

## 2021-07-22 NOTE — Telephone Encounter (Signed)
Called and spoke to patient regarding his results. He states he has been on a keto diet for several months attempting to lose weight to get his numbers down and after these results, he has stopped the diet. He attributes the high fat intake to the results, but I did make him aware of his prior LDL in 7/22 that was 139. He said that he has not been taking his zetia because after 1 dose he had "excruciating kidney pain." Pt informed that pharmD clinic will be contacting him regarding potential for starting injectable medication. Pt is also requesting order for BMET to check potassium and wants his A1C done as well. I advised pt that he should contact his PCP, but pt states he is a hard stick and "I love the girl yal have there in the lab, she's great, tell them I'll pay to have it done there." ? ?Will route to Dr Elease Hashimoto for approval. ?

## 2021-07-23 NOTE — Addendum Note (Signed)
Addended by: Lars Mage on: 07/23/2021 01:54 PM ? ? Modules accepted: Orders ? ?

## 2021-07-23 NOTE — Telephone Encounter (Signed)
Nahser, Deloris Ping, MD  You 2 hours ago (11:26 AM)  ? ?Yes,  sorry , BMP is ok too   ? ? You  Nahser, Deloris Ping, MD Yesterday (1:33 PM)  ? ?Are you okay with the BMET also? (to check his K+, he is worried that with the supplementation you added, it may get too high)   ? ? Nahser, Deloris Ping, MD  You Yesterday (12:39 PM)  ? ?Thanks for the great note Maralyn Sago,  ? ?Since he does have elevated glucose on several of his most recent BMP's , I think it's ok that we get a HBA1C and we will forward that information to his primary MD  ? ?I Appreciate your help  ? ?Phil   ?Orders placed at this time and pt made aware. Lab appt scheduled for 5/27 ?

## 2021-07-23 NOTE — Addendum Note (Signed)
Addended by: Lars Mage on: 07/23/2021 01:47 PM ? ? Modules accepted: Orders ? ?

## 2021-07-23 NOTE — Addendum Note (Signed)
Addended by: Lars Mage on: 07/23/2021 01:53 PM ? ? Modules accepted: Orders ? ?

## 2021-07-30 DIAGNOSIS — D049 Carcinoma in situ of skin, unspecified: Secondary | ICD-10-CM | POA: Diagnosis not present

## 2021-07-30 DIAGNOSIS — L281 Prurigo nodularis: Secondary | ICD-10-CM | POA: Diagnosis not present

## 2021-08-03 ENCOUNTER — Other Ambulatory Visit: Payer: Self-pay

## 2021-08-03 ENCOUNTER — Other Ambulatory Visit: Payer: BC Managed Care – PPO | Admitting: *Deleted

## 2021-08-03 DIAGNOSIS — I10 Essential (primary) hypertension: Secondary | ICD-10-CM | POA: Diagnosis not present

## 2021-08-03 DIAGNOSIS — E782 Mixed hyperlipidemia: Secondary | ICD-10-CM

## 2021-08-03 LAB — BASIC METABOLIC PANEL
BUN/Creatinine Ratio: 14 (ref 10–24)
BUN: 15 mg/dL (ref 8–27)
CO2: 23 mmol/L (ref 20–29)
Calcium: 9.1 mg/dL (ref 8.6–10.2)
Chloride: 101 mmol/L (ref 96–106)
Creatinine, Ser: 1.05 mg/dL (ref 0.76–1.27)
Glucose: 93 mg/dL (ref 70–99)
Potassium: 3.9 mmol/L (ref 3.5–5.2)
Sodium: 137 mmol/L (ref 134–144)
eGFR: 80 mL/min/{1.73_m2} (ref >59)

## 2021-08-03 LAB — HEMOGLOBIN A1C
Est. average glucose Bld gHb Est-mCnc: 108 mg/dL
Hgb A1c MFr Bld: 5.4 % (ref 4.8–5.6)

## 2021-08-24 NOTE — Progress Notes (Signed)
Patient ID: [name]                 DOB: 12/09/1960                    MRN: [id number] ? ? ? ? ?HPI: ?Ruben Simmons is a 61 y.o. male patient referred to lipid clinic by Dr. Elease Hashimoto. PMH is significant for GERD, HTN, and HLD. Pt saw Dr. Elease Hashimoto on 03/03/21 and he scheduled for pt to get a calcium score to assess his risk. CT calcium score on 04/14/22 showed a score of 11.3 (38th percentile for age, sex, and race). Despite low score, Dr. Elease Hashimoto prescribed ezetimibe 10 mg daily given LDL was 139 on 11/21/20 labs. Repeat labs on 07/21/21 showed LDL still elevated at 137. Pt reported not being on ezetimibe because he took one dose and it caused him "excruciating kidney pain". Pt was referred to lipid clinic for additional help with cholesterol lowering. ? ?Pt presents today for lipid clinic appointment. Pt states he has tried multiple statins and they all caused the same kidney pain he experienced on ezetimibe along with muscle aches. He does take "Durable Heart" a supplement he says reports to drop cholesterol by 36%. He got them 1 month ago, but is not taking them twice daily like it states because they are "horse pills." He did ask if there was anything else that was "natural" to help lower his cholesterol ? ?Current Medications: Durable Heart supplement 1 capsule daily ?Intolerances: statins (pravastatin, atorvastatin, rosuvastatin) - kidney pain and muscle aches, ezetimibe 10 mg daily (stopped after 1 dose d/t "kidney pain") ?Risk Factors: ASCVD (CAC score 11.3), HTN ? ?LDL goal: <70 mg/dL ? ?Labs: ?07/21/21: TC 205, TG 191, HDL 33, LDL 137 (no medicine) ?11/21/20: TC 192, TG 177, HDL 33, LDL 139 (no medicine) ? ?Diet: Eats out a lot for lunch (travels a lot). Goes out for dinner frequently; Was on Keto and stopped after his cholesterol increased.  ? ?Exercise: No regular exercise. Walks dogs on occasion - 1 to 1.5 miles with frequent stops. Does a lot of yard work. ? ?Family History: None pertinent ? ?Social History:  Denies tobacco and illicit drug use. Reports occasional alcohol ? ?Past Medical History:  ?Diagnosis Date  ? GERD (gastroesophageal reflux disease)   ? Hypertension   ? ? ?Current Outpatient Medications on File Prior to Visit  ?Medication Sig Dispense Refill  ? aspirin 81 MG EC tablet Take 1 tablet by mouth daily.    ? cefdinir (OMNICEF) 300 MG capsule Take 300 mg by mouth 2 (two) times daily.    ? esomeprazole (NEXIUM) 20 MG capsule Take 20 mg by mouth daily at 12 noon.    ? hydrochlorothiazide (MICROZIDE) 12.5 MG capsule Take 1 capsule (12.5 mg total) by mouth daily. 90 capsule 3  ? losartan (COZAAR) 50 MG tablet Take 1 tablet (50 mg total) by mouth daily. 90 tablet 3  ? potassium chloride (KLOR-CON) 10 MEQ tablet Take 1 tablet (10 mEq total) by mouth daily. 90 tablet 3  ? ?No current facility-administered medications on file prior to visit.  ? ? ?Allergies  ?Allergen Reactions  ? Dexamethasone Other (See Comments)  ?  other  ? Penicillin G Other (See Comments)  ?  Patient states that he feels like he is on steroids and upsets his stomach  ? Penicillins   ?  GI upset  ? Statins Other (See Comments)  ?  other  ?  Prednisone Anxiety  ? ? ?Assessment/Plan: ? ?1. Hyperlipidemia - LDL 137 above goal < 70 given elevated calcium score of 11.8. Pt intolerant of statins and ezetimibe, is agreeable to trying PCSK9 inhibitor. Prior authorization for Repatha is approved and will call to give him co-pay card information. Discussed safety and efficacy profile of PCSK9i and showed him how to administer the medication. Educated pt about FDA not regulating herbal supplements and to keep in mind when reading statements about potential effects. Discussed making changes to diet and exercise were great ways to help lower LDL, but there are no natural supplements that are as effective as medications. Will schedule labs for 6 weeks out from medication start. ? ?Patient seen with Jennefer Bravo, PY4 pharmacy student ? ?Pervis Hocking,  PharmD ?PGY2 Resident ? ?Olene Floss, Pharm.D, BCPS, CPP ?[facility]  ?[address] [place], [place] [address]  ?Phone: [phone]; Fax: [phone]  ? ? ?

## 2021-08-25 ENCOUNTER — Encounter: Payer: Self-pay | Admitting: Student-PharmD

## 2021-08-25 ENCOUNTER — Ambulatory Visit: Payer: BC Managed Care – PPO | Admitting: Student-PharmD

## 2021-08-25 ENCOUNTER — Telehealth: Payer: Self-pay | Admitting: Student-PharmD

## 2021-08-25 DIAGNOSIS — E782 Mixed hyperlipidemia: Secondary | ICD-10-CM

## 2021-08-25 MED ORDER — REPATHA SURECLICK 140 MG/ML ~~LOC~~ SOAJ: 140.0000 mg | SUBCUTANEOUS | 11 refills | Status: DC

## 2021-08-25 NOTE — Telephone Encounter (Signed)
Called patient to let him know PA for Repatha has been approved. Called his pharmacy to see what the copay is which is $50. Signed him up for a copay card to bring the cost down to $5/month:  ?RxBin: [id number]  ?RxPCN: [id number]  ?RxGrp: [id number]  ?ID: [id number] ? ?Was unable to reach the patient so LVM requesting call back. Will need to schedule him for fasting labs.  ?

## 2021-08-25 NOTE — Patient Instructions (Signed)
Your LDL goal is <70 ? ?Will submit prior auth for medication and will call if approved to give you co-pay card information for either Repatha or Praluent. ? ?Inject medication into stomach or upper outer thigh once every 2 weeks. Store pens in the fridge (can be left out at room temperature for up to 30 days). Alternative injection sites to decrease site reactions (redness, soreness). ? ?Will recheck labs after being on medication for 6 weeks. ?

## 2021-08-26 NOTE — Telephone Encounter (Signed)
Patient responded to MyChart message. Scheduled for labs 10/19/21.  ?

## 2021-09-04 ENCOUNTER — Ambulatory Visit: Payer: BC Managed Care – PPO | Admitting: Cardiovascular Disease

## 2021-09-04 ENCOUNTER — Encounter: Payer: Self-pay | Admitting: Cardiovascular Disease

## 2021-09-04 VITALS — BP 120/80 | HR 70 | Ht 71.0 in | Wt 240.4 lb

## 2021-09-04 DIAGNOSIS — Z79899 Other long term (current) drug therapy: Secondary | ICD-10-CM

## 2021-09-04 DIAGNOSIS — E782 Mixed hyperlipidemia: Secondary | ICD-10-CM | POA: Diagnosis not present

## 2021-09-04 DIAGNOSIS — I10 Essential (primary) hypertension: Secondary | ICD-10-CM | POA: Diagnosis not present

## 2021-09-04 MED ORDER — POTASSIUM CHLORIDE CRYS ER 10 MEQ PO TBCR: 10.0000 meq | EXTENDED_RELEASE_TABLET | Freq: Two times a day (BID) | ORAL | 1 refills | Status: DC

## 2021-09-04 NOTE — Progress Notes (Addendum)
?Cardiology Office Note:   ? ?Date:  09/04/2021  ? ?ID:  Ruben Simmons, DOB 29-Jun-1960, MRN [id number] ? ?PCP:  [place], [facility] ?  ?[facility] Providers ?Cardiologist:  Angie Hogg  ?Click to update primary MD,subspecialty MD or APP then REFRESH:1}   ? ?Referring MD: Roe Co[place]bs[place], [place]*  ? ?Chief Complaint  ?Patient presents with  ? Hypertension  ?   ?  ? Hyperlipidemia  ? ? ?November 24, 2020: ?  ? ?Ruben Simmons is a 61 y.o. male with a hx of HTN.  ?We were asked to see him by Dr. Doristine CounterBurnett for further evalutaion of his HTN ? ?Friend of NiSourceChuck Furr. ? ?Ruben Simmons is seen today for further management of his hypertension. 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 ?Diet is poor ?Walks the dogs on occasion -  1-1.5 miles , frequent stops  ?[facility] [facility] [employer] [facility] 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  ? ?No regular exercise,  ?Eats out every day for lunch ,  ?Goes out for dinner frequently .  ? ?Labs from his primary medical doctor's office reveals relatively normal CBC.  His hemoglobin is 14.8.  Platelet count is 230.  White blood cell count is 6.3. ? ?PSA 0.44. ? ?Lipid profile reveals ?LDL is 139 ?Triglyceride level is 177 ?HDL is 33 ?Total cholesterol is 192 ? ?Hemoglobin A1c is 5.9 ? ?Complete metabolic profile reveals a potassium of 3.8.  Glucose is 98. ?Creatinine 1.15  ? ? ?Liver enzymes are within normal limits. ?Has tried statins but couild not tolerate any of them  ? ?Oct. 25, 2022: ?Ruben Simmons is seen today for follow up of his HTN, HLD  ? ? ?Bp is well controlled  ?Wt is 242 lbs. ( Down 3 lbs)  ? ?We discussed his ASA therapy.  ?Is intolerent to statins . ? ? ?September 04, 2021: ?Ruben Simmons is seen today for follow-up of his hypertension and hyperlipidemia. ?His weight today is 240 pounds which is down 2 pounds from his last visit.  Ruben Simmons is seen today for follow-up of his hypertension and hyperlipidemia.  His weight is 240 pounds  which is down 2 pounds since his last visit. ?Blood pressure is well controlled. ? ?Has not tolerated statins  ?LDL was 137 ?Started Repatha today  ? ?Past Medical History:  ?Diagnosis Date  ? GERD (gastroesophageal reflux disease)   ? Hypertension   ? ? ?No past surgical history on file. ? ?Current Medications: ?Current Meds  ?Medication Sig  ? esomeprazole (NEXIUM) 20 MG capsule Take 20 mg by mouth daily at 12 noon.  ? Evolocumab (REPATHA SURECLICK) 140 MG/ML SOAJ Inject 140 mg into the skin every 14 (fourteen) days.  ? hydrochlorothiazide (MICROZIDE) 12.5 MG capsule Take 1 capsule (12.5 mg total) by mouth daily.  ? losartan (COZAAR) 50 MG tablet Take 1 tablet (50 mg total) by mouth daily.  ? OVER THE COUNTER MEDICATION Take 1 capsule by mouth daily. Durable Heart  ? potassium chloride (KLOR-CON M) 10 MEQ tablet Take 1 tablet (10 mEq total) by mouth 2 (two) times daily.  ? [DISCONTINUED] potassium chloride (KLOR-CON) 10 MEQ tablet Take 1 tablet (10 mEq total) by mouth daily.  ?  ? ?Allergies:   Dexamethasone, Penicillin g, Penicillins, Statins, and Prednisone  ? ?Social History  ? ?Socioeconomic History  ? Marital status: Married  ?  Spouse name: Not on  file  ? Number of children: Not on file  ? Years of education: Not on file  ? Highest education level: Not on file  ?Occupational History  ? Not on file  ?Tobacco Use  ? Smoking status: Never  ? Smokeless tobacco: Never  ?Vaping Use  ? Vaping Use: Never used  ?Substance and Sexual Activity  ? Alcohol use: Yes  ?  Comment: Occassionally  ? Drug use: Not on file  ? Sexual activity: Not on file  ?Other Topics Concern  ? Not on file  ?Social History Narrative  ? Not on file  ? ?Social Determinants of Health  ? ?Financial Resource Strain: Not on file  ?Food Insecurity: Not on file  ?Transportation Needs: Not on file  ?Physical Activity: Not on file  ?Stress: Not on file  ?Social Connections: Not on file  ?  ? ?Family History: ?The patient's family history includes  Parkinson's disease in his father. ? ?ROS:   ?Please see the history of present illness.    ? All other systems reviewed and are negative. ? ?EKGs/Labs/Other Studies Reviewed:   ? ?The following studies were reviewed today: ? ? ?EKG:   ? ? ?Recent Labs: ?07/21/2021: ALT 30 ?08/03/2021: BUN 15; Creatinine, Ser 1.05; Potassium 3.9; Sodium 137  ?Recent Lipid Panel ?   ?Component Value Date/Time  ? CHOL 205 (H) 07/21/2021 9030  ? TRIG 191 (H) 07/21/2021 0923  ? HDL 33 (L) 07/21/2021 3007  ? CHOLHDL 6.2 (H) 07/21/2021 6226  ? LDLCALC 137 (H) 07/21/2021 3335  ? ? ? ?Risk Assessment/Calculations:   ?  ? ?    ? ?Physical Exam:   ? ?Physical Exam: ?Blood pressure 120/80, pulse 70, height 5\' 11"  (1.803 m), weight 240 lb 6.4 oz (109 kg), SpO2 96 %. ? ?GEN:  Well nourished, well developed in no acute distress ?HEENT: Normal ?NECK: No JVD; No carotid bruits ?LYMPHATICS: No lymphadenopathy ?CARDIAC: RRR , no murmurs, rubs, gallops ?RESPIRATORY:  Clear to auscultation without rales, wheezing or rhonchi  ?ABDOMEN: Soft, non-tender, non-distended ?MUSCULOSKELETAL:  No edema; No deformity  ?SKIN: Warm and dry ?NEUROLOGIC:  Alert and oriented x 3 ? ? ? ?ASSESSMENT:   ? ?1. Medication management   ?2. Mixed hyperlipidemia   ?3. Primary hypertension   ? ? ? ?PLAN:   ? ? ?Hypertension: Blood pressure is well controlled.  Potassium is 3.9.  We will increase his K. Dur to 10 mg twice a day.  Recheck basic metabolic profile in 6 weeks with his lipid levels. ? ?  ? ?2.  Hyperlipidemia:  ?Lipids remain elevated.  He does not tolerate statins.  Started Repatha today. ? ?We will see him again in 1 year. ?  ?   ? ? ?Medication Adjustments/Labs and Tests Ordered: ?Current medicines are reviewed at length with the patient today.  Concerns regarding medicines are outlined above.  ?Orders Placed This Encounter  ?Procedures  ? Basic metabolic panel  ? ? ? ?Meds ordered this encounter  ?Medications  ? potassium chloride (KLOR-CON M) 10 MEQ tablet  ?   Sig: Take 1 tablet (10 mEq total) by mouth 2 (two) times daily.  ?  Dispense:  180 tablet  ?  Refill:  1  ?  Dose increase  ? ? ? ? ? ?Patient Instructions  ?Medication Instructions:  ? ?INCREASE YOUR POTASSIUM CHLORIDE TO 10 mEq BY MOUTH TWICE DAILY ? ?*If you need a refill on your cardiac medications before your next appointment, please call your  pharmacy* ? ? ?Lab Work: ? ?AS SCHEDULED FOR 10/19/21 HERE IN THE OFFICE--WILL ADD A BMET TO THE EXISTING LABS FOR THAT VISIT ? ?If you have labs (blood work) drawn today and your tests are completely normal, you will receive your results only by: ?MyChart Message (if you have MyChart) OR ?A paper copy in the mail ?If you have any lab test that is abnormal or we need to change your treatment, we will call you to review the results. ? ? ? ?Follow-Up: ?At [facility], you and your health needs are our priority.  As part of our continuing mission to provide you with exceptional heart care, we have created designated Provider Care Teams.  These Care Teams include your primary Cardiologist (physician) and Advanced Practice Providers (APPs -  Physician Assistants and Nurse Practitioners) who all work together to provide you with the care you need, when you need it. ? ?We recommend signing up for the patient portal called "MyChart".  Sign up information is provided on this After Visit Summary.  MyChart is used to connect with patients for Virtual Visits (Telemedicine).  Patients are able to view lab/test results, encounter notes, upcoming appointments, etc.  Non-urgent messages can be sent to your provider as well.   ?To learn more about what you can do with MyChart, go to [email or web address].   ? ?Your next appointment:   ?1 year(s) ? ?The format for your next appointment:   ?In Person ? ?Provider:   ?DR. Marlinda Miranda ? ?Important Information About Sugar ? ? ? ? ? ?  ? ?Signed, ?Kristeen Miss, MD  ?09/04/2021 4:24 PM    ?[facility] ? ?

## 2021-09-04 NOTE — Patient Instructions (Signed)
Medication Instructions:  ? ?INCREASE YOUR POTASSIUM CHLORIDE TO 10 mEq BY MOUTH TWICE DAILY ? ?*If you need a refill on your cardiac medications before your next appointment, please call your pharmacy* ? ? ?Lab Work: ? ?AS SCHEDULED FOR 10/19/21 HERE IN THE OFFICE--WILL ADD A BMET TO THE EXISTING LABS FOR THAT VISIT ? ?If you have labs (blood work) drawn today and your tests are completely normal, you will receive your results only by: ?MyChart Message (if you have MyChart) OR ?A paper copy in the mail ?If you have any lab test that is abnormal or we need to change your treatment, we will call you to review the results. ? ? ? ?Follow-Up: ?At [facility], you and your health needs are our priority.  As part of our continuing mission to provide you with exceptional heart care, we have created designated Provider Care Teams.  These Care Teams include your primary Cardiologist (physician) and Advanced Practice Providers (APPs -  Physician Assistants and Nurse Practitioners) who all work together to provide you with the care you need, when you need it. ? ?We recommend signing up for the patient portal called "MyChart".  Sign up information is provided on this After Visit Summary.  MyChart is used to connect with patients for Virtual Visits (Telemedicine).  Patients are able to view lab/test results, encounter notes, upcoming appointments, etc.  Non-urgent messages can be sent to your provider as well.   ?To learn more about what you can do with MyChart, go to [email or web address].   ? ?Your next appointment:   ?1 year(s) ? ?The format for your next appointment:   ?In Person ? ?Provider:   ?DR. NAHSER ? ?Important Information About Sugar ? ? ? ? ? ? ?

## 2021-09-23 DIAGNOSIS — M6208 Separation of muscle (nontraumatic), other site: Secondary | ICD-10-CM | POA: Diagnosis not present

## 2021-09-23 DIAGNOSIS — K644 Residual hemorrhoidal skin tags: Secondary | ICD-10-CM | POA: Diagnosis not present

## 2021-09-23 DIAGNOSIS — R222 Localized swelling, mass and lump, trunk: Secondary | ICD-10-CM | POA: Diagnosis not present

## 2021-10-19 ENCOUNTER — Other Ambulatory Visit: Payer: BC Managed Care – PPO

## 2021-10-19 DIAGNOSIS — E782 Mixed hyperlipidemia: Secondary | ICD-10-CM | POA: Diagnosis not present

## 2021-10-19 DIAGNOSIS — Z79899 Other long term (current) drug therapy: Secondary | ICD-10-CM

## 2021-10-19 DIAGNOSIS — I10 Essential (primary) hypertension: Secondary | ICD-10-CM

## 2021-10-19 LAB — LIPID PANEL
Chol/HDL Ratio: 2.3 ratio (ref 0.0–5.0)
Cholesterol, Total: 101 mg/dL (ref 100–199)
HDL: 43 mg/dL (ref >39)
LDL Chol Calc (NIH): 40 mg/dL (ref 0–99)
Triglycerides: 94 mg/dL (ref 0–149)
VLDL Cholesterol Cal: 18 mg/dL (ref 5–40)

## 2021-10-19 LAB — BASIC METABOLIC PANEL
BUN/Creatinine Ratio: 11 (ref 10–24)
BUN: 13 mg/dL (ref 8–27)
CO2: 25 mmol/L (ref 20–29)
Calcium: 9.3 mg/dL (ref 8.6–10.2)
Chloride: 104 mmol/L (ref 96–106)
Creatinine, Ser: 1.22 mg/dL (ref 0.76–1.27)
Glucose: 116 mg/dL — ABNORMAL HIGH (ref 70–99)
Potassium: 3.9 mmol/L (ref 3.5–5.2)
Sodium: 141 mmol/L (ref 134–144)
eGFR: 67 mL/min/{1.73_m2} (ref >59)

## 2021-10-19 LAB — HEPATIC FUNCTION PANEL
ALT: 31 IU/L (ref 0–44)
AST: 19 IU/L (ref 0–40)
Albumin: 4.2 g/dL (ref 3.8–4.8)
Alkaline Phosphatase: 70 IU/L (ref 44–121)
Bilirubin Total: 0.4 mg/dL (ref 0.0–1.2)
Bilirubin, Direct: 0.14 mg/dL (ref 0.00–0.40)
Total Protein: 6.8 g/dL (ref 6.0–8.5)

## 2021-11-22 ENCOUNTER — Other Ambulatory Visit: Payer: Self-pay | Admitting: Cardiovascular Disease

## 2021-11-22 DIAGNOSIS — I10 Essential (primary) hypertension: Secondary | ICD-10-CM

## 2021-11-22 DIAGNOSIS — Z79899 Other long term (current) drug therapy: Secondary | ICD-10-CM

## 2021-11-22 DIAGNOSIS — E782 Mixed hyperlipidemia: Secondary | ICD-10-CM

## 2021-11-23 MED ORDER — POTASSIUM CHLORIDE CRYS ER 10 MEQ PO TBCR: 10.0000 meq | EXTENDED_RELEASE_TABLET | Freq: Two times a day (BID) | ORAL | 2 refills | Status: DC

## 2021-12-07 DIAGNOSIS — Z08 Encounter for follow-up examination after completed treatment for malignant neoplasm: Secondary | ICD-10-CM | POA: Diagnosis not present

## 2021-12-07 DIAGNOSIS — L570 Actinic keratosis: Secondary | ICD-10-CM | POA: Diagnosis not present

## 2021-12-07 DIAGNOSIS — D2239 Melanocytic nevi of other parts of face: Secondary | ICD-10-CM | POA: Diagnosis not present

## 2021-12-07 DIAGNOSIS — Z85828 Personal history of other malignant neoplasm of skin: Secondary | ICD-10-CM | POA: Diagnosis not present

## 2021-12-21 DIAGNOSIS — R1310 Dysphagia, unspecified: Secondary | ICD-10-CM | POA: Diagnosis not present

## 2021-12-21 DIAGNOSIS — R1011 Right upper quadrant pain: Secondary | ICD-10-CM | POA: Diagnosis not present

## 2021-12-21 DIAGNOSIS — R1012 Left upper quadrant pain: Secondary | ICD-10-CM | POA: Diagnosis not present

## 2021-12-21 DIAGNOSIS — Z1211 Encounter for screening for malignant neoplasm of colon: Secondary | ICD-10-CM | POA: Diagnosis not present

## 2022-02-08 DIAGNOSIS — R051 Acute cough: Secondary | ICD-10-CM | POA: Diagnosis not present

## 2022-02-25 DIAGNOSIS — Z1211 Encounter for screening for malignant neoplasm of colon: Secondary | ICD-10-CM | POA: Diagnosis not present

## 2022-02-25 DIAGNOSIS — K635 Polyp of colon: Secondary | ICD-10-CM | POA: Diagnosis not present

## 2022-02-25 DIAGNOSIS — R1310 Dysphagia, unspecified: Secondary | ICD-10-CM | POA: Diagnosis not present

## 2022-04-12 DIAGNOSIS — C4442 Squamous cell carcinoma of skin of scalp and neck: Secondary | ICD-10-CM | POA: Diagnosis not present

## 2022-04-12 DIAGNOSIS — D044 Carcinoma in situ of skin of scalp and neck: Secondary | ICD-10-CM | POA: Diagnosis not present

## 2022-04-12 DIAGNOSIS — L570 Actinic keratosis: Secondary | ICD-10-CM | POA: Diagnosis not present

## 2022-04-28 DIAGNOSIS — J329 Chronic sinusitis, unspecified: Secondary | ICD-10-CM | POA: Diagnosis not present

## 2022-04-28 DIAGNOSIS — K146 Glossodynia: Secondary | ICD-10-CM | POA: Diagnosis not present

## 2022-06-01 ENCOUNTER — Emergency Department (HOSPITAL_BASED_OUTPATIENT_CLINIC_OR_DEPARTMENT_OTHER): Payer: 59

## 2022-06-01 ENCOUNTER — Other Ambulatory Visit: Payer: Self-pay

## 2022-06-01 ENCOUNTER — Emergency Department (HOSPITAL_BASED_OUTPATIENT_CLINIC_OR_DEPARTMENT_OTHER)
Admission: EM | Admit: 2022-06-01 | Discharge: 2022-06-02 | Disposition: A | Discharge status: Home / Self Care | Payer: 59 | Attending: Emergency Medicine | Admitting: Emergency Medicine

## 2022-06-01 ENCOUNTER — Encounter (HOSPITAL_BASED_OUTPATIENT_CLINIC_OR_DEPARTMENT_OTHER): Payer: Self-pay

## 2022-06-01 DIAGNOSIS — R0602 Shortness of breath: Secondary | ICD-10-CM | POA: Diagnosis not present

## 2022-06-01 DIAGNOSIS — R7989 Other specified abnormal findings of blood chemistry: Secondary | ICD-10-CM | POA: Diagnosis not present

## 2022-06-01 DIAGNOSIS — R200 Anesthesia of skin: Secondary | ICD-10-CM | POA: Insufficient documentation

## 2022-06-01 DIAGNOSIS — I10 Essential (primary) hypertension: Secondary | ICD-10-CM | POA: Diagnosis not present

## 2022-06-01 LAB — COMPREHENSIVE METABOLIC PANEL
ALT: 30 U/L (ref 0–44)
AST: 18 U/L (ref 15–41)
Albumin: 4.1 g/dL (ref 3.5–5.0)
Alkaline Phosphatase: 48 U/L (ref 38–126)
Anion gap: 10 (ref 5–15)
BUN: 17 mg/dL (ref 8–23)
CO2: 26 mmol/L (ref 22–32)
Calcium: 9.4 mg/dL (ref 8.9–10.3)
Chloride: 101 mmol/L (ref 98–111)
Creatinine, Ser: 1.22 mg/dL (ref 0.61–1.24)
GFR, Estimated: >60 mL/min (ref >60)
Glucose, Bld: 98 mg/dL (ref 70–99)
Potassium: 3.4 mmol/L — ABNORMAL LOW (ref 3.5–5.1)
Sodium: 137 mmol/L (ref 135–145)
Total Bilirubin: 0.4 mg/dL (ref 0.3–1.2)
Total Protein: 7.4 g/dL (ref 6.5–8.1)

## 2022-06-01 LAB — CBC
HCT: 41.9 % (ref 39.0–52.0)
Hemoglobin: 14.7 g/dL (ref 13.0–17.0)
MCH: 32 pg (ref 26.0–34.0)
MCHC: 35.1 g/dL (ref 30.0–36.0)
MCV: 91.1 fL (ref 80.0–100.0)
Platelets: 213 10*3/uL (ref 150–400)
RBC: 4.6 MIL/uL (ref 4.22–5.81)
RDW: 12.6 % (ref 11.5–15.5)
WBC: 7.6 10*3/uL (ref 4.0–10.5)
nRBC: 0 % (ref 0.0–0.2)

## 2022-06-01 LAB — D-DIMER, QUANTITATIVE: D-Dimer, Quant: 0.97 ug/mL-FEU — ABNORMAL HIGH (ref 0.00–0.50)

## 2022-06-01 LAB — TROPONIN I (HIGH SENSITIVITY)
Troponin I (High Sensitivity): 2 ng/L (ref <18)
Troponin I (High Sensitivity): 3 ng/L (ref <18)

## 2022-06-01 LAB — ETHANOL: Alcohol, Ethyl (B): <10 mg/dL (ref <10)

## 2022-06-01 MED ORDER — SODIUM CHLORIDE 0.9 % IV BOLUS
500.0000 mL | Freq: Once | INTRAVENOUS | Status: AC
  Administered 2022-06-01: 500 mL via INTRAVENOUS

## 2022-06-01 MED ORDER — IOHEXOL 350 MG/ML SOLN
100.0000 mL | Freq: Once | INTRAVENOUS | Status: AC | PRN
  Administered 2022-06-01: 75 mL via INTRAVENOUS

## 2022-06-01 NOTE — ED Provider Notes (Signed)
Patient seen and evaluated at [facility] earlier today, 62 year old male with history of hypertension with left-sided facial numbness without weakness, as well as was having some leg pain and swelling secondary to recent fall.  He was thoroughly evaluated [facility], no evidence of PE, DVT, and unremarkable CVA at this time, however with ongoing concern for neurologic impairment plan discussed with neurology to send to [facility] for MRI.  Patient is pending MRI at this time.  Having ongoing numbness of trigeminal V2, V3.  I independently interpreted an MRI brain which shows no evidence of acute intracranial abnormality.  There is some chronic microvascular changes noted.  Spoke with Dr. Malen Gauze who reports that this patient can follow-up outpatient with the neurology clinic, will request ambulatory referral to [facility] neurology.  Patient discharged in stable condition at this time, return precautions given.   Dorien Chihuahua 06/02/22 0229    Ripley Fraise, MD 06/02/22 [phone]

## 2022-06-01 NOTE — ED Provider Notes (Incomplete)
Patient seen and evaluated at [facility] earlier today, 62 year old male with history of hypertension with left-sided facial numbness without weakness, as well as was having some leg pain and swelling secondary to recent fall.  He was thoroughly evaluated [facility], no evidence of PE, DVT, and unremarkable CVA at this time, however with ongoing concern for neurologic impairment plan discussed with neurology to send to [facility] for MRI.  Patient is pending MRI at this time.  Having ongoing numbness of trigeminal V2, V3.

## 2022-06-01 NOTE — ED Provider Notes (Signed)
[facility] Provider Note   CSN: [id number] Arrival date & time: 06/01/22  1736     History  Chief Complaint  Patient presents with   Numbness    Ruben Simmons is a 62 y.o. male, history of hypertension, who presents to the ED secondary to multiple complaints.  He states that about 2 days ago he started having left-sided facial numbness, that has been persistent, denies any droop, weakness on any of the extremities, any confusion, vision changes, changes in speech.  States the same day that he developed this, he tripped, and bruised up his right foot, and states is very swollen, and now states that he is having cramping behind both knees.  Additionally here because he developed shortness of breath that started about an hour ago, denies any chest pain, states shortness of breath is exertional.  States when he was walking he got a little bit dizzy, but denies any current dizziness.     Home Medications Prior to Admission medications   Medication Sig Start Date End Date Taking? Authorizing Provider  esomeprazole (NEXIUM) 20 MG capsule Take 20 mg by mouth daily at 12 noon.    [provider]  Evolocumab (REPATHA SURECLICK) 259 MG/ML SOAJ Inject 140 mg into the skin every 14 (fourteen) days. 08/25/21   Nahser, Wonda Cheng, MD  hydrochlorothiazide (MICROZIDE) 12.5 MG capsule TAKE 1 CAPSULE BY MOUTH EVERY DAY 11/23/21   Nahser, Wonda Cheng, MD  losartan (COZAAR) 50 MG tablet TAKE 1 TABLET BY MOUTH EVERY DAY 11/23/21   Nahser, Wonda Cheng, MD  OVER THE COUNTER MEDICATION Take 1 capsule by mouth daily. Durable Heart    [provider]  potassium chloride (KLOR-CON M) 10 MEQ tablet Take 1 tablet (10 mEq total) by mouth 2 (two) times daily. 11/23/21   Nahser, Wonda Cheng, MD      Allergies    Dexamethasone, Penicillin g, Penicillins, Statins, and Prednisone    Review of Systems   Review of Systems  Respiratory:  Positive for shortness of breath.    Cardiovascular:  Negative for chest pain.  Neurological:  Positive for dizziness.    Physical Exam Updated Vital Signs BP (!) 153/97   Pulse 74   Resp (!) 22   Ht 5\' 10"  (1.778 m)   Wt 109.8 kg   SpO2 91%   BMI 34.72 kg/m  Physical Exam Vitals and nursing note reviewed.  Constitutional:      General: He is not in acute distress.    Appearance: He is well-developed.  HENT:     Head: Normocephalic and atraumatic.  Eyes:     Conjunctiva/sclera: Conjunctivae normal.  Cardiovascular:     Rate and Rhythm: Normal rate and regular rhythm.     Heart sounds: No murmur heard. Pulmonary:     Effort: Pulmonary effort is normal. No respiratory distress.     Breath sounds: Normal breath sounds.  Abdominal:     Palpations: Abdomen is soft.     Tenderness: There is no abdominal tenderness.  Musculoskeletal:        General: No swelling.     Cervical back: Neck supple.     Right lower leg: 1+ Edema present.  Skin:    General: Skin is warm and dry.     Capillary Refill: Capillary refill takes less than 2 seconds.  Neurological:     Mental Status: He is alert.     Comments: Decreased sensation to the trigeminal nerve V2  and V3.  No facial droop slurring of words, equal strength of bilateral upper and bilateral lower extremities.  No difficulty with speech, tongue midline.  Psychiatric:        Mood and Affect: Mood normal.     ED Results / Procedures / Treatments   Labs (all labs ordered are listed, but only abnormal results are displayed) Labs Reviewed  D-DIMER, QUANTITATIVE - Abnormal; Notable for the following components:      Result Value   D-Dimer, Quant 0.97 (*)    All other components within normal limits  CBC  COMPREHENSIVE METABOLIC PANEL  ETHANOL  TROPONIN I (HIGH SENSITIVITY)  TROPONIN I (HIGH SENSITIVITY)    EKG EKG Interpretation  Date/Time:  Tuesday June 01 2022 17:48:35 EST Ventricular Rate:  79 PR Interval:  165 QRS Duration: 125 QT  Interval:  368 QTC Calculation: 422 R Axis:   7 Text Interpretation: Sinus rhythm Nonspecific intraventricular conduction delay Confirmed by Regan Lemming ([id number]) on 06/01/2022 6:57:21 PM  Radiology No results found.  Procedures Procedures   Medications Ordered in ED Medications  sodium chloride 0.9 % bolus 500 mL (has no administration in time range)    ED Course/ Medical Decision Making/ A&P                           Medical Decision Making Patient is a 62 year old male, here for multiple issues including numbness of left side of his face, he has no neurodeficits other than the sensory deficit, and it appears to be in the V2 and V3 of trigeminal nerve.  Will obtain a stat head CT to evaluate for possible infarct.  Additionally we will obtain orthostatics given his dizziness.  Also here for shortness of breath that is worse on exertion will obtain troponins, chest x-ray, EKG, and electrolytes for further evaluation.  Will give a little bit of IV fluids secondary to his dizziness once orthostatics are obtained.  He also has swelling to his right lower leg, and we will obtain an ultrasound of his leg to rule out a DVT given his shortness of breath and recent trauma and neurologic symptoms.  Amount and/or Complexity of Data Reviewed Labs: ordered.    Details: Elevated D-dimer 0.97 Radiology: ordered. Discussion of management or test interpretation with external provider(s): Patient is a 62 year old male, here for multiple complaints, D-dimer is elevated at this time we will need to obtain a CTA of his chest, additionally DVT study pending.  CT pending and other labs pending at this time.  Discussed with [facility] PA, she will take over care, and dispo patient.    Final Clinical Impression(s) / ED Diagnoses Final diagnoses:  Left facial numbness  Shortness of breath    Rx / DC Orders ED Discharge Orders     None         Diamantina Monks, Si Gaul, PA 06/01/22 1926    Regan Lemming,  MD 06/02/22 [phone]

## 2022-06-01 NOTE — ED Provider Notes (Cosign Needed Addendum)
Accepted handoff at shift change from [facility]. Please see prior provider note for more detail.   Briefly: Patient is 62 y.o. presents to the ED with multiple complaints.  2 days ago he began having left-sided facial numbness that has been persistent but has not had any facial droop or weakness to his extremities.  Patient states the same day he developed facial numbness, he tripped and injured his right foot and is now having cramping behind both knees.  Patient states he also developed shortness of breath that started today but does not have any chest pain.  DDX: concern for TIA, CVA, trigeminal neuralgia, DVT, PE, acute fracture or dislocation to the right ankle or foot  Plan: Plan at time of handoff is to wait for all laboratory workup to be completed.  Patient had a elevated D-dimer at 0.97, therefore, a CTA was ordered.  DVT study is still pending.  Will wait for workup to be completed, consult neuro for recommendation regarding patient's facial numbness.   Results for orders placed or performed during the hospital encounter of 06/01/22  CBC  Result Value Ref Range   WBC 7.6 4.0 - 10.5 K/uL   RBC 4.60 4.22 - 5.81 MIL/uL   Hemoglobin 14.7 13.0 - 17.0 g/dL   HCT 41.9 39.0 - 52.0 %   MCV 91.1 80.0 - 100.0 fL   MCH 32.0 26.0 - 34.0 pg   MCHC 35.1 30.0 - 36.0 g/dL   RDW 12.6 11.5 - 15.5 %   Platelets 213 150 - 400 K/uL   nRBC 0.0 0.0 - 0.2 %  Comprehensive metabolic panel  Result Value Ref Range   Sodium 137 135 - 145 mmol/L   Potassium 3.4 (L) 3.5 - 5.1 mmol/L   Chloride 101 98 - 111 mmol/L   CO2 26 22 - 32 mmol/L   Glucose, Bld 98 70 - 99 mg/dL   BUN 17 8 - 23 mg/dL   Creatinine, Ser 1.22 0.61 - 1.24 mg/dL   Calcium 9.4 8.9 - 10.3 mg/dL   Total Protein 7.4 6.5 - 8.1 g/dL   Albumin 4.1 3.5 - 5.0 g/dL   AST 18 15 - 41 U/L   ALT 30 0 - 44 U/L   Alkaline Phosphatase 48 38 - 126 U/L   Total Bilirubin 0.4 0.3 - 1.2 mg/dL   GFR, Estimated >60 >60 mL/min   Anion gap 10 5 -  15  D-dimer, quantitative  Result Value Ref Range   D-Dimer, Quant 0.97 (H) 0.00 - 0.50 ug/mL-FEU  Ethanol  Result Value Ref Range   Alcohol, Ethyl (B) <10 <10 mg/dL  Troponin I (High Sensitivity)  Result Value Ref Range   Troponin I (High Sensitivity) 2 <18 ng/L   No results found.  Physical Exam  BP (!) 153/97   Pulse 74   Resp (!) 22   Ht 5\' 10"  (1.778 m)   Wt 109.8 kg   SpO2 91%   BMI 34.72 kg/m   Physical Exam Vitals and nursing note reviewed.  Constitutional:      General: He is not in acute distress.    Appearance: Normal appearance. He is not ill-appearing or diaphoretic.  HENT:     Head: Normocephalic.  Eyes:     Extraocular Movements: Extraocular movements intact.     Pupils: Pupils are equal, round, and reactive to light.  Cardiovascular:     Rate and Rhythm: Normal rate and regular rhythm.     Pulses: Normal pulses.  Heart sounds: Normal heart sounds.  Pulmonary:     Effort: Pulmonary effort is normal.     Breath sounds: Normal breath sounds.  Skin:    General: Skin is warm and dry.     Capillary Refill: Capillary refill takes less than 2 seconds.  Neurological:     Mental Status: He is alert. Mental status is at baseline.     Comments: Numbness in the V2 branch of the trigeminal nerve.  No CN VII palsy, no slurring of speech, tongue is midline.  No difficulty swallowing.    Psychiatric:        Mood and Affect: Mood normal.        Behavior: Behavior normal.     Procedures  Procedures  ED Course / MDM    Medical Decision Making Amount and/or Complexity of Data Reviewed Labs: ordered. Radiology: ordered.  Risk Prescription drug management.   Patient's CTA was negative for PE.  All imaging has returned and he is negative for acute intracranial abnormality, DVT, or acute fracture or dislocation to the right foot and ankle.   Given that patient has numbness to the left side of the face in the trigeminal V2 and V3 branches, consultation was  requested with neurology.  I spoke with Dr. Rory Percy who recommended patient get an MRI given his history of hypertension and hyperlipidemia.  Patient will be ER to ER transferred to [facility] for MRI and disposition will be decided from there.  Dr. Tyrone Nine agreed to accept patient.         Pat Kocher, [place] 06/01/22 2152    Pat Kocher, [place] 06/01/22 2152    Regan Lemming, MD 06/02/22 Valerie Roys

## 2022-06-01 NOTE — ED Triage Notes (Signed)
Pt arrives POV from home with c/o of left side facial numbness began 2 days ago.  Reports dizziness that began about an hour ago.  Reports shortness of breath that began about an hour ago, denies CP  Reports nausea, denies vomiting.    Drove himself to ED.  BIB WC, transferred from [facility] to stretcher without difficulty.

## 2022-06-02 ENCOUNTER — Emergency Department (HOSPITAL_COMMUNITY): Payer: 59

## 2022-06-02 MED ORDER — ACETAMINOPHEN 325 MG PO TABS
650.0000 mg | ORAL_TABLET | Freq: Once | ORAL | Status: AC
  Administered 2022-06-02: 650 mg via ORAL
  Filled 2022-06-02: qty 2

## 2022-07-15 ENCOUNTER — Encounter: Payer: Self-pay | Admitting: Orthopedic Surgery

## 2022-07-15 ENCOUNTER — Ambulatory Visit (INDEPENDENT_AMBULATORY_CARE_PROVIDER_SITE_OTHER): Payer: 59

## 2022-07-15 ENCOUNTER — Ambulatory Visit (INDEPENDENT_AMBULATORY_CARE_PROVIDER_SITE_OTHER): Payer: 59 | Admitting: Orthopedic Surgery

## 2022-07-15 DIAGNOSIS — M25562 Pain in left knee: Secondary | ICD-10-CM

## 2022-07-15 MED ORDER — CEPHALEXIN 500 MG PO CAPS: 500.0000 mg | ORAL_CAPSULE | Freq: Two times a day (BID) | ORAL | 0 refills | Status: DC

## 2022-07-15 MED ORDER — MELOXICAM 15 MG PO TABS: 15.0000 mg | ORAL_TABLET | Freq: Every day | ORAL | 0 refills | Status: DC

## 2022-07-16 NOTE — Progress Notes (Unsigned)
Office Visit Note   Patient: Ruben Simmons           Date of Birth: Oct 12, 1960           MRN: [id number] Visit Date: 07/15/2022 Requested by: No referring provider defined for this encounter. PCP: Pcp, No  Subjective: Chief Complaint  Patient presents with   Left Knee - Pain    HPI: Ruben Simmons is a 62 y.o. male who presents to the office reporting left knee pain.  He has been alternating hard soled shoe for both feet due to toe fracture sustained 05/29/2022.  Started having knee pain 2 days ago after being put in a boot and shoe for both feet.  Reports swelling and pain anteriorly.  Taking Tylenol for symptoms.  Has no history of gout or pseudogout.  Denies any fevers or chills..                ROS: All systems reviewed are negative as they relate to the chief complaint within the history of present illness.  Patient denies fevers or chills.  Assessment & Plan: Visit Diagnoses:  1. Left knee pain, unspecified chronicity     Plan: Impression is left knee pain with possible early prepatellar bursitis which may or may not be septic.  Does have distinct temperature difference with the left anterior knee being warmer than the right.  There is no effusion present.  He has not really spent a lot of time on his knees recently.  Plan at this time is Mobic for 5 days along with Keflex for 5 days.  Plan to see him back on Monday with [facility] for quick recheck to evaluate symptoms.  Follow-Up Instructions: No follow-ups on file.   Orders:  Orders Placed This Encounter  Procedures   XR KNEE 3 VIEW LEFT   Meds ordered this encounter  Medications   meloxicam (MOBIC) 15 MG tablet    Sig: Take 1 tablet (15 mg total) by mouth daily.    Dispense:  30 tablet    Refill:  0   cephALEXin (KEFLEX) 500 MG capsule    Sig: Take 1 capsule (500 mg total) by mouth 2 (two) times daily.    Dispense:  10 capsule    Refill:  0      Procedures: No procedures performed   Clinical Data: No additional  findings.  Objective: Vital Signs: There were no vitals taken for this visit.  Physical Exam:  Constitutional: Patient appears well-developed HEENT:  Head: Normocephalic Eyes:EOM are normal Neck: Normal range of motion Cardiovascular: Normal rate Pulmonary/chest: Effort normal Neurologic: Patient is alert Skin: Skin is warm Psychiatric: Patient has normal mood and affect  Ortho Exam: Ortho exam demonstrates normal gait and alignment.  His left great toe has some swelling but pretty reasonable range of motion.  Right toes 4 and 5 have some swelling in the fifth toe but no real tenderness to palpation or pain with range of motion.  I think he is fine to go into regular shoes with the toes.  Regarding the left knee there is no effusion full range of motion but definite warmth anteriorly on the left compared to the right.  No proximal lymphadenopathy.  No groin pain with internal or external rotation of either hip.  Range of motion is full.  No tissue crepitus is present and no bursal fluid present in the anterior part of the knee.  No distinct redness present but there is some tenderness to palpation  as well as warmth.  Radiographs of the knee unremarkable.  Specialty Comments:  No specialty comments available.  Imaging: No results found.   PMFS History: There are no problems to display for this patient.  Past Medical History:  Diagnosis Date   GERD (gastroesophageal reflux disease)    Hypertension     Family History  Problem Relation Age of Onset   Parkinson's disease Father     History reviewed. No pertinent surgical history. Social History   Occupational History   Not on file  Tobacco Use   Smoking status: Never   Smokeless tobacco: Never  Vaping Use   Vaping Use: Never used  Substance and Sexual Activity   Alcohol use: Yes    Comment: Occassionally   Drug use: Not Currently   Sexual activity: Not on file

## 2022-07-19 ENCOUNTER — Ambulatory Visit (INDEPENDENT_AMBULATORY_CARE_PROVIDER_SITE_OTHER): Payer: 59 | Admitting: Surgical

## 2022-07-19 DIAGNOSIS — M25562 Pain in left knee: Secondary | ICD-10-CM

## 2022-07-20 ENCOUNTER — Encounter: Payer: Self-pay | Admitting: Surgical

## 2022-07-20 ENCOUNTER — Other Ambulatory Visit: Payer: Self-pay | Admitting: Surgical

## 2022-07-20 MED ORDER — IBUPROFEN 800 MG PO TABS: 800.0000 mg | ORAL_TABLET | Freq: Two times a day (BID) | ORAL | 0 refills | Status: DC

## 2022-07-20 NOTE — Progress Notes (Signed)
Follow-up Office Visit Note   Patient: Ruben Simmons           Date of Birth: 12/03/1960           MRN: [id number] Visit Date: 07/19/2022 Requested by: No referring provider defined for this encounter. PCP: Pcp, No  Subjective: Chief Complaint  Patient presents with   Left Knee - Follow-up    HPI: Ruben Simmons is a 62 y.o. male who returns to the office for follow-up visit.    Plan at last visit was: Impression is left knee pain with possible early prepatellar bursitis which may or may not be septic. Does have distinct temperature difference with the left anterior knee being warmer than the right. There is no effusion present. He has not really spent a lot of time on his knees recently. Plan at this time is Mobic for 5 days along with Keflex for 5 days. Plan to see him back on Monday with Ruben Simmons for quick recheck to evaluate symptoms.   Since then, patient notes his left knee is doing a lot better.  He feels about 70% better.  No fevers or chills.  No loss of appetite.  Does not feel under the weather.  He almost forgets that his knee hurts at times.  He does state that his blood pressure has been increasingly high since starting these medications at his last visit.  Has never had difficulty with his blood pressure from Motrin or any antibiotics              ROS: All systems reviewed are negative as they relate to the chief complaint within the history of present illness.  Patient denies fevers or chills.  Assessment & Plan: Visit Diagnoses:  1. Left knee pain, unspecified chronicity     Plan: TENZIN DICKEL is a 62 y.o. male who returns to the office for follow-up visit for left knee prepatellar bursitis.  Plan from last visit was noted above in HPI.  They now return with significant relief of pain since starting Mobic and Keflex.  Plan to finish out Keflex and switch from meloxicam to Motrin given his difficulty with blood pressure.  Never had blood pressure difficulty with Motrin before.   Follow-up in 2 weeks for clinical recheck but he will call the office sooner if he has any infectious symptoms which were discussed in detail today.  Follow-Up Instructions: No follow-ups on file.   Orders:  No orders of the defined types were placed in this encounter.  No orders of the defined types were placed in this encounter.     Procedures: No procedures performed   Clinical Data: No additional findings.  Objective: Vital Signs: There were no vitals taken for this visit.  Physical Exam:  Constitutional: Patient appears well-developed HEENT:  Head: Normocephalic Eyes:EOM are normal Neck: Normal range of motion Cardiovascular: Normal rate Pulmonary/chest: Effort normal Neurologic: Patient is alert Skin: Skin is warm Psychiatric: Patient has normal mood and affect  Ortho Exam: Ortho exam demonstrates left knee without effusion.  No redness or cellulitis noted.  He has no fluctuance but there is a small amount of bogginess in the prepatellar bursa that is nontender.  Patient appears well.  Has no pain with passive motion of the knee from 0 degrees to greater than 100 degrees.  Able to perform straight leg raise without extensor lag.  Specialty Comments:  No specialty comments available.  Imaging: No results found.   PMFS History: There are  no problems to display for this patient.  Past Medical History:  Diagnosis Date   GERD (gastroesophageal reflux disease)    Hypertension     Family History  Problem Relation Age of Onset   Parkinson's disease Father     No past surgical history on file. Social History   Occupational History   Not on file  Tobacco Use   Smoking status: Never   Smokeless tobacco: Never  Vaping Use   Vaping Use: Never used  Substance and Sexual Activity   Alcohol use: Yes    Comment: Occassionally   Drug use: Not Currently   Sexual activity: Not on file

## 2022-07-21 ENCOUNTER — Ambulatory Visit (INDEPENDENT_AMBULATORY_CARE_PROVIDER_SITE_OTHER): Payer: 59 | Admitting: Diagnostic Neuroimaging

## 2022-07-21 ENCOUNTER — Encounter: Payer: Self-pay | Admitting: Diagnostic Neuroimaging

## 2022-07-21 VITALS — BP 143/86 | HR 67 | Ht 71.0 in | Wt 243.0 lb

## 2022-07-21 DIAGNOSIS — R200 Anesthesia of skin: Secondary | ICD-10-CM | POA: Diagnosis not present

## 2022-07-21 NOTE — Progress Notes (Signed)
[facility]  PATIENT: Ruben Simmons DOB: 07/18/1960  REFERRING CLINICIAN: Prosperi, Christian H, * HISTORY FROM: patient  REASON FOR VISIT: new consult    HISTORICAL  CHIEF COMPLAINT:  Chief Complaint  Patient presents with   New Patient (Initial Visit)    Patient in room #7 and alone. Pt states for a few month he's been having facial numbness.    HISTORY OF PRESENT ILLNESS:   62 year old male here for evaluation of left facial numbness.  05/29/2022 patient was on vacation in the [place], when he got out of bed, lost balance and fell forward.  He landed on his face and was briefly knocked unconscious.  He came to fairly quickly but noticed pain and numbness on his left nasal and maxillary region.  Once he came back to the [place] he noticed some additional symptoms went to emergency room for evaluation.  CT and MRI of the brain were unremarkable.  Since that time symptoms are gradually improving.  He only has a mild residual numbness of his left nasal and maxillary region.    REVIEW OF SYSTEMS: Full 14 system review of systems performed and negative with exception of: as per HPI.  ALLERGIES: Allergies  Allergen Reactions   Dexamethasone Other (See Comments)    other   Penicillin G Other (See Comments)    Patient states that he feels like he is on steroids and upsets his stomach   Penicillins     GI upset   Statins Other (See Comments)    other   Prednisone Anxiety    HOME MEDICATIONS: Outpatient Medications Prior to Visit  Medication Sig Dispense Refill   esomeprazole (NEXIUM) 20 MG capsule Take 20 mg by mouth daily at 12 noon.     Evolocumab (REPATHA SURECLICK) [id number] MG/ML SOAJ Inject 140 mg into the skin every 14 (fourteen) days. 2 mL 11   hydrochlorothiazide (MICROZIDE) 12.5 MG capsule TAKE 1 CAPSULE BY MOUTH EVERY DAY 90 capsule 2   ibuprofen (ADVIL) 800 MG tablet Take 1 tablet (800 mg total) by mouth every 12 (twelve) hours. 30 tablet 0   losartan  (COZAAR) 50 MG tablet TAKE 1 TABLET BY MOUTH EVERY DAY 90 tablet 2   potassium chloride (KLOR-CON M) 10 MEQ tablet Take 1 tablet (10 mEq total) by mouth 2 (two) times daily. 180 tablet 2   cephALEXin (KEFLEX) 500 MG capsule Take 1 capsule (500 mg total) by mouth 2 (two) times daily. (Patient not taking: Reported on 07/21/2022) 10 capsule 0   OVER THE COUNTER MEDICATION Take 1 capsule by mouth daily. Durable Heart     No facility-administered medications prior to visit.    PAST MEDICAL HISTORY: Past Medical History:  Diagnosis Date   GERD (gastroesophageal reflux disease)    Hypertension     PAST SURGICAL HISTORY: History reviewed. No pertinent surgical history.  FAMILY HISTORY: Family History  Problem Relation Age of Onset   Parkinson's disease Father     SOCIAL HISTORY: Social History   Socioeconomic History   Marital status: Married    Spouse name: Not on file   Number of children: Not on file   Years of education: Not on file   Highest education level: Not on file  Occupational History   Not on file  Tobacco Use   Smoking status: Never   Smokeless tobacco: Never  Vaping Use   Vaping Use: Never used  Substance and Sexual Activity   Alcohol use: Yes    Comment:  Occassionally   Drug use: Not Currently   Sexual activity: Not on file  Other Topics Concern   Not on file  Social History Narrative   Not on file   Social Determinants of Health   Financial Resource Strain: Not on file  Food Insecurity: Not on file  Transportation Needs: Not on file  Physical Activity: Not on file  Stress: Not on file  Social Connections: Not on file  Intimate Partner Violence: Not on file     PHYSICAL EXAM  GENERAL EXAM/CONSTITUTIONAL: Vitals:  Vitals:   07/21/22 1159  BP: (!) 143/86  Pulse: 67  Weight: 243 lb (110.2 kg)  Height: '5\' 11"'$  (1.803 m)   Body mass index is 33.89 kg/m. Wt Readings from Last 3 Encounters:  07/21/22 243 lb (110.2 kg)  06/01/22 242 lb (109.8  kg)  09/04/21 240 lb 6.4 oz (109 kg)   Patient is in no distress; well developed, nourished and groomed; neck is supple  CARDIOVASCULAR: Examination of carotid arteries is normal; no carotid bruits Regular rate and rhythm, no murmurs Examination of peripheral vascular system by observation and palpation is normal  EYES: Ophthalmoscopic exam of optic discs and posterior segments is normal; no papilledema or hemorrhages No results found.  MUSCULOSKELETAL: Gait, strength, tone, movements noted in Neurologic exam below  NEUROLOGIC: MENTAL STATUS:      No data to display         awake, alert, oriented to person, place and time recent and remote memory intact normal attention and concentration language fluent, comprehension intact, naming intact fund of knowledge appropriate  CRANIAL NERVE:  2nd - no papilledema on fundoscopic exam 2nd, 3rd, 4th, 6th - pupils equal and reactive to light, visual fields full to confrontation, extraocular muscles intact, no nystagmus 5th - facial sensation --> EXCEPT SLIGHT DECR SENS IN LEFT V2 REGION (PARANASAL / MAXILLARY) 7th - facial strength symmetric 8th - hearing intact 9th - palate elevates symmetrically, uvula midline 11th - shoulder shrug symmetric 12th - tongue protrusion midline  MOTOR:  normal bulk and tone, full strength in the BUE, BLE  SENSORY:  normal and symmetric to light touch, temperature, vibration  COORDINATION:  finger-nose-finger, fine finger movements normal  REFLEXES:  deep tendon reflexes TRACE and symmetric  GAIT/STATION:  narrow based gait     DIAGNOSTIC DATA (LABS, IMAGING, TESTING) - I reviewed patient records, labs, notes, testing and imaging myself where available.  Lab Results  Component Value Date   WBC 7.6 06/01/2022   HGB 14.7 06/01/2022   HCT 41.9 06/01/2022   MCV 91.1 06/01/2022   PLT 213 06/01/2022      Component Value Date/Time   NA 137 06/01/2022 1855   NA 141 10/19/2021 0923    K 3.4 (L) 06/01/2022 1855   CL 101 06/01/2022 1855   CO2 26 06/01/2022 1855   GLUCOSE 98 06/01/2022 1855   BUN 17 06/01/2022 1855   BUN 13 10/19/2021 0923   CREATININE 1.22 06/01/2022 1855   CALCIUM 9.4 06/01/2022 1855   PROT 7.4 06/01/2022 1855   PROT 6.8 10/19/2021 0923   ALBUMIN 4.1 06/01/2022 1855   ALBUMIN 4.2 10/19/2021 0923   AST 18 06/01/2022 1855   ALT 30 06/01/2022 1855   ALKPHOS 48 06/01/2022 1855   BILITOT 0.4 06/01/2022 1855   BILITOT 0.4 10/19/2021 0923   GFRNONAA >60 06/01/2022 1855   GFRAA >60 03/27/2019 2147   Lab Results  Component Value Date   CHOL 101 10/19/2021   HDL  43 10/19/2021   LDLCALC 40 10/19/2021   TRIG 94 10/19/2021   CHOLHDL 2.3 10/19/2021   Lab Results  Component Value Date   HGBA1C 5.4 08/03/2021   No results found for: "VITAMINB12" No results found for: "TSH"   06/01/22 CT head 1. No acute intracranial abnormality. 2. Generalized cerebral atrophy and microvascular disease changes of the supratentorial brain.  06/02/22 MRI brain (without) [I reviewed images myself and agree with interpretation. -VRP]  1. No acute intracranial abnormality. 2. Mild chronic microvascular ischemic disease for age.    ASSESSMENT AND PLAN  62 y.o. year old male here with:   Dx:  1. Left facial numbness     PLAN:  POST-TRAUMATIC LEFT FACIAL NUMBNESS (left V2 branch; May 29, 2022; improving) - monitor symptoms; should gradually improve over time  Return for return to PCP, pending if symptoms worsen or fail to improve.    Penni Bombard, MD [id number], 0000000 PM Certified in Neurology, Neurophysiology and Neuroimaging  [facility] [address] [place], [place] [address] [phone]

## 2022-07-22 ENCOUNTER — Telehealth: Payer: Self-pay

## 2022-07-22 NOTE — Telephone Encounter (Addendum)
Pharmacy Patient Advocate Encounter   Received notification from cmm that prior authorization for Repatha 140mg /ml is required/requested.     PA submitted on 3.14.24 to (ins) [employer] via CoverMyMeds  Key  # [id number]  Status is pending  (Per pts plan a decision will be made by 3/23)

## 2022-07-29 NOTE — Telephone Encounter (Signed)
Pharmacy Patient Advocate Encounter  Prior Authorization for Repatha 140mg /ml has been approved by [employer] (ins).    PA # [id number]   Effective dates: 3.21.24 through 3.21.25

## 2022-08-18 ENCOUNTER — Other Ambulatory Visit: Payer: Self-pay | Admitting: Cardiovascular Disease

## 2022-09-17 ENCOUNTER — Other Ambulatory Visit: Payer: Self-pay | Admitting: Cardiovascular Disease

## 2022-10-01 ENCOUNTER — Other Ambulatory Visit: Payer: Self-pay | Admitting: Cardiovascular Disease

## 2022-10-01 DIAGNOSIS — Z79899 Other long term (current) drug therapy: Secondary | ICD-10-CM

## 2022-10-01 DIAGNOSIS — E782 Mixed hyperlipidemia: Secondary | ICD-10-CM

## 2022-10-01 DIAGNOSIS — I10 Essential (primary) hypertension: Secondary | ICD-10-CM

## 2022-10-03 IMAGING — CT CT CARDIAC CORONARY ARTERY CALCIUM SCORE
3 series · 14 of 20 positions shown, 16 images · non-contrast
Comparison: None.
COMPARISON: None.

Addendum:
EXAM:
OVER-READ INTERPRETATION  CT CHEST

The following report is an over-read performed by radiologist Dr.
Giovanni Eady [REDACTED] on 04/14/2021. This
over-read does not include interpretation of cardiac or coronary
anatomy or pathology. The coronary calcium score interpretation by
the cardiologist is attached.
CLINICAL DATA: 60M for cardiovascular disease risk stratification
Coronary Calcium Score
TECHNIQUE: A gated, non-contrast computed tomography scan of the heart was
performed using 3mm slice thickness. Axial images were analyzed on a
dedicated workstation. Calcium scoring of the coronary arteries was
performed using the Agatston method.

[Series 2: cascseq 2.0 sa36 (id) (id) · axial · 0.50mm/px · z∈[-60,+30]mm · 4 of 76 slices shown]
[im 16/76  vessel]
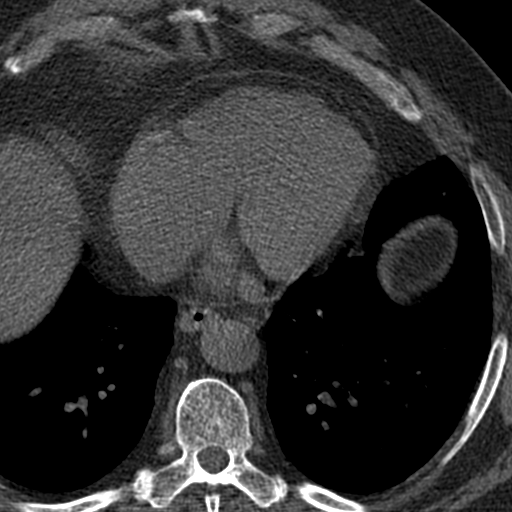
[im 31/76  vessel]
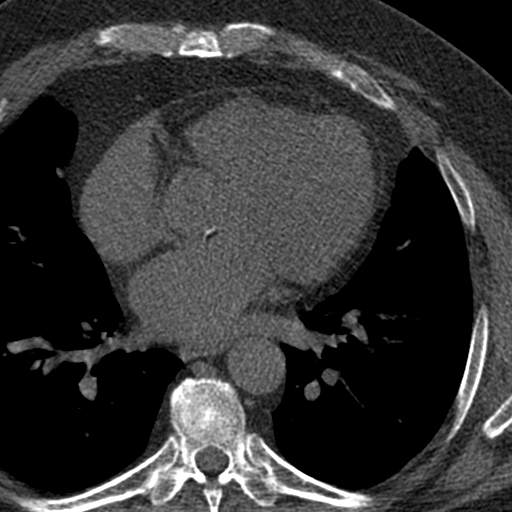
[im 46/76  vessel]
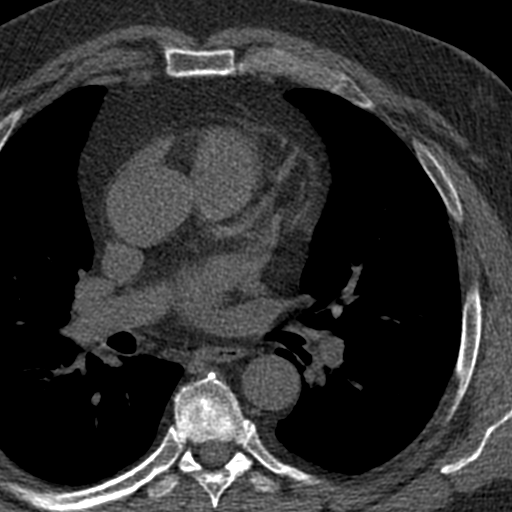
[im 61/76  vessel]
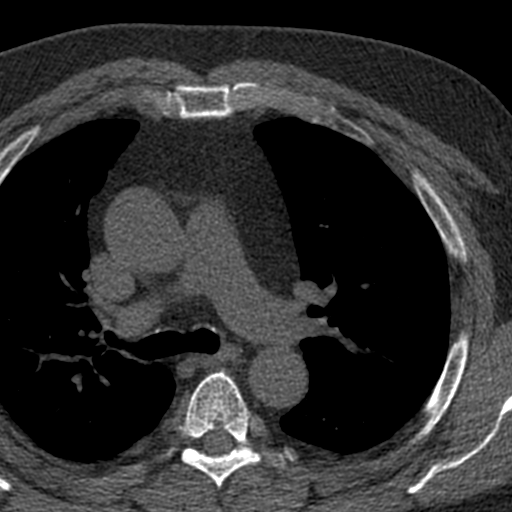

[Series 3: cascseq 2.0 bf37 st · axial · 0.77mm/px · z∈[-66,+34]mm · 5 of 76 slices shown, 7 images]
[im 13/76  vessel]
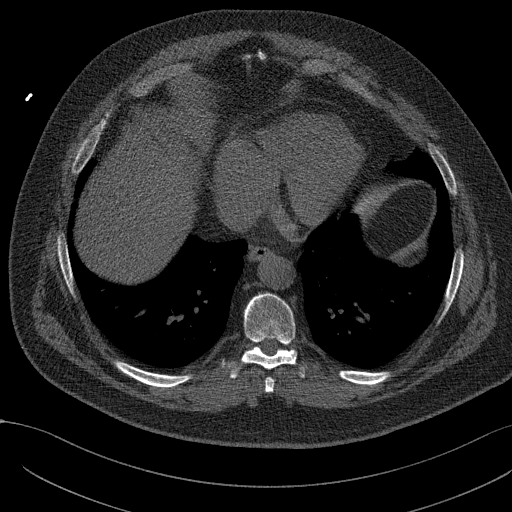
[im 13/76  lung]
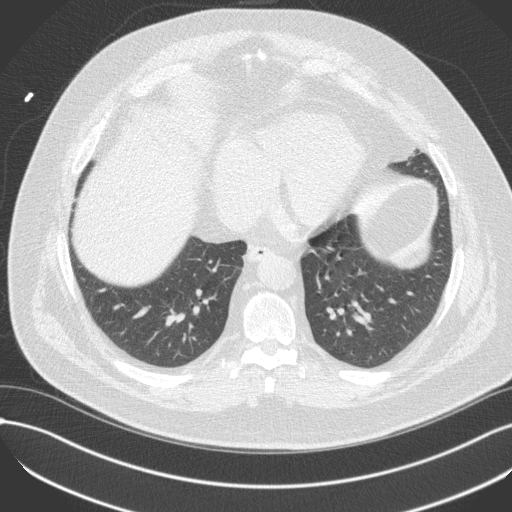
[im 26/76  vessel]
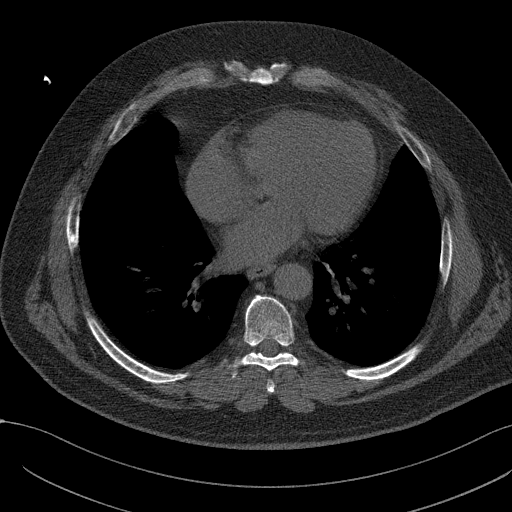
[im 38/76  vessel]
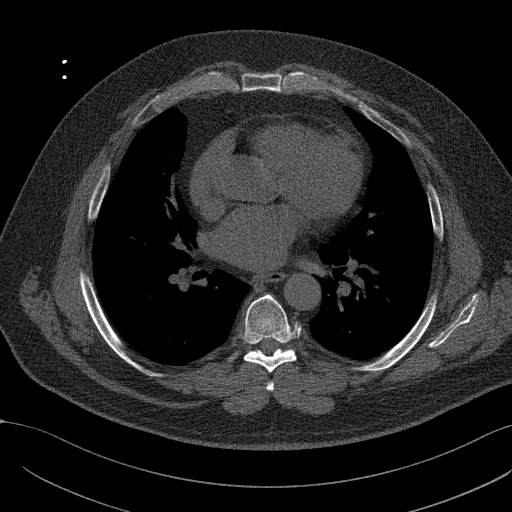
[im 51/76  vessel]
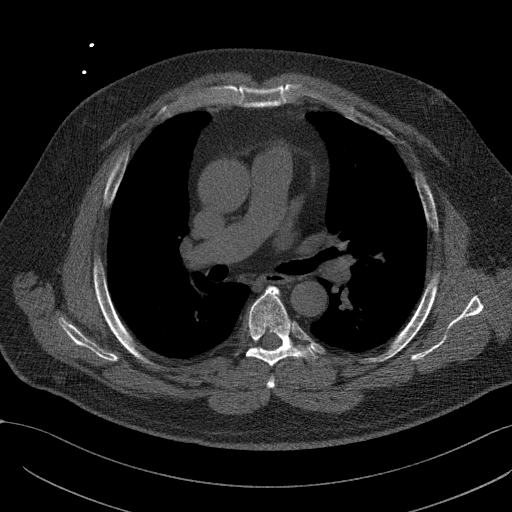
[im 63/76  vessel]
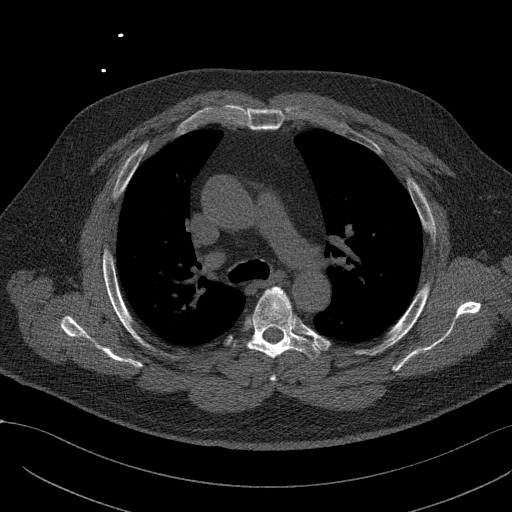
[im 63/76  lung]
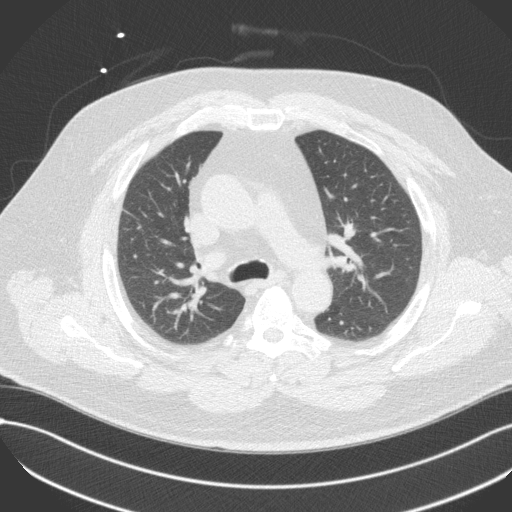

[Series 4: cascseq 2.0 br59 lung · axial · 0.77mm/px · z∈[-66,+34]mm · 5 of 76 slices shown]
[im 13/76  lung]
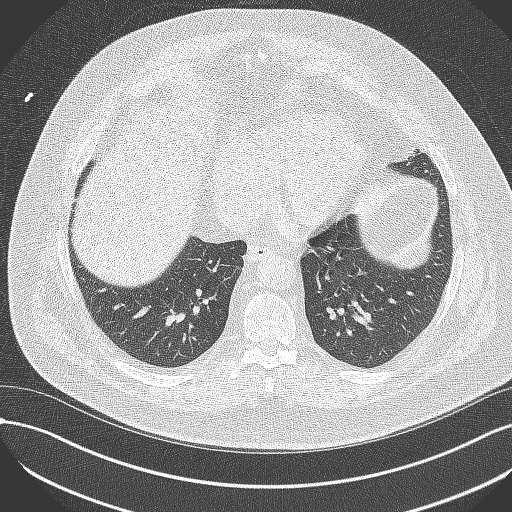
[im 26/76  lung]
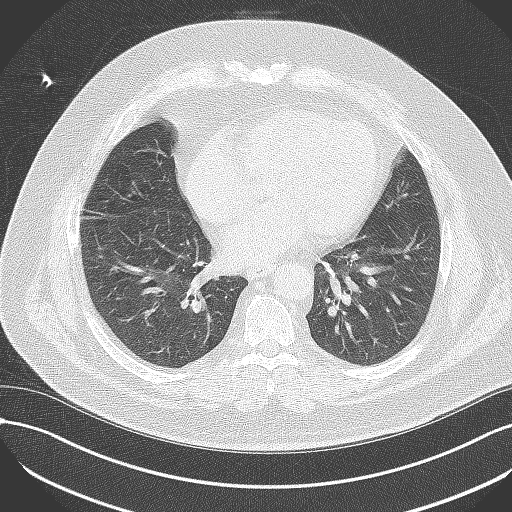
[im 38/76  lung]
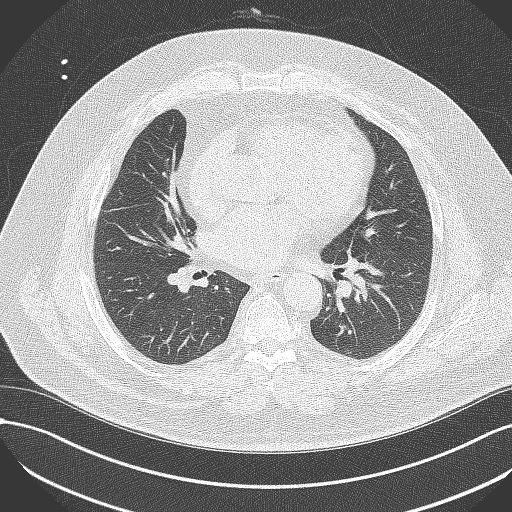
[im 51/76  lung]
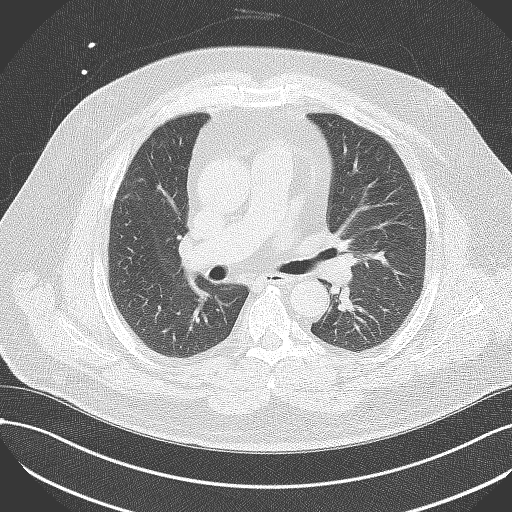
[im 63/76  lung]
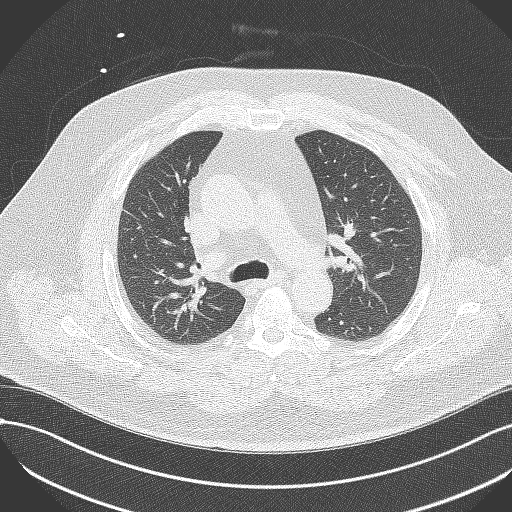

[14 of 20 positions shown; findings below may reference images not displayed]

FINDINGS: Within the visualized portions of the thorax there are no suspicious
appearing pulmonary nodules or masses, there is no acute
consolidative airspace disease, no pleural effusions, no
pneumothorax and no lymphadenopathy. Visualized portions of the
upper abdomen are unremarkable. There are no aggressive appearing
lytic or blastic lesions noted in the visualized portions of the
skeleton.
IMPRESSION: 1. No significant incidental noncardiac findings are noted.
FINDINGS: Coronary arteries: Normal origins.

Coronary Calcium Score:

Left main: 0

Left anterior descending artery:7.28

Left circumflex artery:

Right coronary artery:

Total:

Percentile: 38th

Pericardium: Normal.

Ascending Aorta: Normal caliber.  Aortic atherosclerosis.

Mild calcification of the aortic valve.

Non-cardiac: See separate report from [REDACTED].
IMPRESSION: Coronary calcium score of 11.3. This was 38th percentile for age-,
race-, and sex-matched controls.

Aortic atherosclerosis.



If CAC=0, it is reasonable to withhold statin therapy and reassess
in 5 to 10 years, as long as higher risk conditions are absent
(diabetes mellitus, family history of premature CHD in first degree
relatives (males <55 years; females <65 years), cigarette smoking,
or LDL >=190 mg/dL).

If CAC is 1 to 99, it is reasonable to initiate statin therapy for
patients >=55 years of age.

If CAC is >=100 or >=75th percentile, it is reasonable to initiate
statin therapy at any age.

Cardiology referral should be considered for patients with CAC
scores >=400 or >=75th percentile.

*6575 AHA/ACC/AACVPR/AAPA/ABC/UEMA/FIFI/NOVA/Aujla/BORIKA BORICIC/ABRIGO/AVANT
Guideline on the Management of Blood Cholesterol: A Report of the
American College of Cardiology/American Heart Association Task Force
on Clinical Practice Guidelines. J Am Coll Cardiol.
7489;73(24):5672-5650.

*** End of Addendum ***
EXAM:
OVER-READ INTERPRETATION  CT CHEST

The following report is an over-read performed by radiologist Dr.
Giovanni Eady [REDACTED] on 04/14/2021. This
over-read does not include interpretation of cardiac or coronary
anatomy or pathology. The coronary calcium score interpretation by
the cardiologist is attached.
FINDINGS: Within the visualized portions of the thorax there are no suspicious
appearing pulmonary nodules or masses, there is no acute
consolidative airspace disease, no pleural effusions, no
pneumothorax and no lymphadenopathy. Visualized portions of the
upper abdomen are unremarkable. There are no aggressive appearing
lytic or blastic lesions noted in the visualized portions of the
skeleton.
IMPRESSION: 1. No significant incidental noncardiac findings are noted.

## 2022-10-16 ENCOUNTER — Other Ambulatory Visit: Payer: Self-pay | Admitting: Cardiovascular Disease

## 2022-11-01 ENCOUNTER — Other Ambulatory Visit: Payer: Self-pay | Admitting: Cardiovascular Disease

## 2022-11-01 DIAGNOSIS — E782 Mixed hyperlipidemia: Secondary | ICD-10-CM

## 2022-11-01 DIAGNOSIS — Z79899 Other long term (current) drug therapy: Secondary | ICD-10-CM

## 2022-11-01 DIAGNOSIS — I10 Essential (primary) hypertension: Secondary | ICD-10-CM

## 2022-11-03 ENCOUNTER — Other Ambulatory Visit: Payer: Self-pay | Admitting: Cardiovascular Disease

## 2022-11-03 DIAGNOSIS — I10 Essential (primary) hypertension: Secondary | ICD-10-CM

## 2022-11-03 DIAGNOSIS — Z79899 Other long term (current) drug therapy: Secondary | ICD-10-CM

## 2022-11-03 DIAGNOSIS — E782 Mixed hyperlipidemia: Secondary | ICD-10-CM

## 2022-11-04 NOTE — Progress Notes (Signed)
Cardiology Office Note:  .   Date:  11/05/2022  ID:  Ruben Simmons, DOB 1961-01-17, MRN [id number] PCP: Richmond Campbell PA-C  [facility] Providers Cardiologist:  Kristeen Miss, MD    Patient Profile: .      PMH hypertension, HLD. Was referred to cardiology and seen by Dr. Elease Hashimoto 11/24/2020 for management of hypertension. Last cardiology clinic visit was 09/04/21 with Dr. Elease Hashimoto at which time BP was well-controlled. Potassium dose was increased due to recent low normal K+ level on lab work. He had been started on Repatha due to intolerance of statins. LDL cholesterol 40 on 10/19/21.        History of Present Illness: .   Ruben Simmons is a very pleasant 62 y.o. male here today for follow-up of hypertension and hyperlipidemia. He reports he has been doing well until 2 days ago when he "felt terrible the entire day,"  noted significantly elevated BP and had headache and visual changes. Hydrates well with water, occasional diet Mt Dew. Is working on building 2 guest houses and admits to "eating crap" for the past few weeks. Continues to work, will fully retire end of November. BP on 11/03/22 was 173/100 with improvement to 165/99. He took lisinopril from previous Rx and BP dropped to 120s over 80.  He reports he had some chest discomfort on the same day, it resolved with improvement in BP.  No other episodes of chest discomfort, shortness of breath, DOE, orthopnea, PND, presyncope, or syncope.  ROS: + elevated home BP with associated visual changes and headache   See HPI    Studies Reviewed: .         Risk Assessment/Calculations:     HYPERTENSION CONTROL Vitals:   11/05/22 0909 11/05/22 0935  BP: (!) 144/80 (!) 140/90    The patient's blood pressure is elevated above target today.  In order to address the patient's elevated BP: A current anti-hypertensive medication was adjusted today.          Physical Exam:   VS:  BP (!) 140/90   Pulse (!) 58   Wt 251 lb (113.9 kg)    SpO2 96%   BMI 35.01 kg/m    Wt Readings from Last 3 Encounters:  11/05/22 251 lb (113.9 kg)  07/21/22 243 lb (110.2 kg)  06/01/22 242 lb (109.8 kg)    GEN: Obese, well developed in no acute distress NECK: No JVD; No carotid bruits CARDIAC: RRR, no murmurs, rubs, gallops RESPIRATORY:  Clear to auscultation without rales, wheezing or rhonchi  ABDOMEN: Soft, non-tender, non-distended EXTREMITIES:  No edema; No deformity     ASSESSMENT AND PLAN: .    Hypertension: BP is elevated and remains so on my recheck. Admits to dietary indiscretion with sodium and weight gain recently. Is symptomatic with headache and visual changes with higher BP. Home BP readings reviewed which reveal consistent BP > 130/80. He requests that we make a slight change because when he has taken additional lisinopril (prior Rx), his BP drops dramatically and he feels poorly.  We will add losartan 25 mg to take in the evenings.  He will continue losartan 50 mg every morning as well as hydrochlorothiazide 12.5 mg daily. Will update lab work to evaluate for renal function and electrolytes in 2 weeks when he can return fasting. Encouraged low sodium, mostly plant based diet and 150 minutes moderate intensity exercise each week.  Hyperlipidemia: LDL 40 on 10/19/21.  Needs prior authorization for Repatha.  Is tolerating well. We will recheck lipid panel in 2 weeks when he can return fasting.   Elevated blood glucose: Is concerned about Repatha increasing his glucose. Reports fasting blood sugar has been borderline. A1C 5.9 on 11/2020. We will recheck. Encouraged 30 to 45 minutes of walking daily for exercise which can lower blood sugar.  Encouraged heart healthy, mostly plant-based diet.        Dispo: 1 year with Dr. Elease Hashimoto or APP  Signed, Eligha Bridegroom, NP-C

## 2022-11-05 ENCOUNTER — Ambulatory Visit: Payer: 59 | Attending: Nurse Practitioner | Admitting: Nurse Practitioner

## 2022-11-05 ENCOUNTER — Encounter: Payer: Self-pay | Admitting: Nurse Practitioner

## 2022-11-05 VITALS — BP 140/90 | HR 58 | Wt 251.0 lb

## 2022-11-05 DIAGNOSIS — Z79899 Other long term (current) drug therapy: Secondary | ICD-10-CM

## 2022-11-05 DIAGNOSIS — E782 Mixed hyperlipidemia: Secondary | ICD-10-CM

## 2022-11-05 DIAGNOSIS — I10 Essential (primary) hypertension: Secondary | ICD-10-CM

## 2022-11-05 DIAGNOSIS — R739 Hyperglycemia, unspecified: Secondary | ICD-10-CM

## 2022-11-05 MED ORDER — POTASSIUM CHLORIDE ER 10 MEQ PO TBCR: 10.0000 meq | EXTENDED_RELEASE_TABLET | Freq: Two times a day (BID) | ORAL | 2 refills | Status: DC

## 2022-11-05 MED ORDER — REPATHA SURECLICK 140 MG/ML ~~LOC~~ SOAJ: 140.0000 mg | SUBCUTANEOUS | 3 refills | Status: DC

## 2022-11-05 MED ORDER — LOSARTAN POTASSIUM 50 MG PO TABS: 50.0000 mg | ORAL_TABLET | Freq: Every morning | ORAL | 2 refills | Status: DC

## 2022-11-05 MED ORDER — HYDROCHLOROTHIAZIDE 12.5 MG PO CAPS: 12.5000 mg | ORAL_CAPSULE | Freq: Every day | ORAL | 2 refills | Status: DC

## 2022-11-05 MED ORDER — LOSARTAN POTASSIUM 25 MG PO TABS: 25.0000 mg | ORAL_TABLET | Freq: Every evening | ORAL | 2 refills | Status: DC

## 2022-11-05 NOTE — Patient Instructions (Addendum)
Medication Instructions:   START TAKING : LOSARTAN 50 MG ONCE  A DAY IN THE MORNING   START TAKING :  LOSARTAN 25 MG ONCE A DAY IN THE EVENING     *If you need a refill on your cardiac medications before your next appointment, please call your pharmacy*   Lab Work:  RETURN FOR LABS JULY FASTING FOR LABS CMET LIPIDS AND A1C  If you have labs (blood work) drawn today and your tests are completely normal, you will receive your results only by: MyChart Message (if you have MyChart) OR A paper copy in the mail If you have any lab test that is abnormal or we need to change your treatment, we will call you to review the results.   Testing/Procedures: NONE ORDERED  TODAY     Follow-Up: At [facility], you and your health needs are our priority.  As part of our continuing mission to provide you with exceptional heart care, we have created designated Provider Care Teams.  These Care Teams include your primary Cardiologist (physician) and Advanced Practice Providers (APPs -  Physician Assistants and Nurse Practitioners) who all work together to provide you with the care you need, when you need it.  We recommend signing up for the patient portal called "MyChart".  Sign up information is provided on this After Visit Summary.  MyChart is used to connect with patients for Virtual Visits (Telemedicine).  Patients are able to view lab/test results, encounter notes, upcoming appointments, etc.  Non-urgent messages can be sent to your provider as well.   To learn more about what you can do with MyChart, go to [email or web address].    Your next appointment:   1 year(s)  Provider:   Dr. Elease Hashimoto  Other Instructions Adopting a Healthy Lifestyle.   Weight: Know what a healthy weight is for you (roughly BMI <25) and aim to maintain this. You can calculate your body mass index on your smart phone  Diet: Aim for 7+ servings of fruits and vegetables daily Limit animal fats in diet for  cholesterol and heart health - choose grass fed whenever available Avoid highly processed foods (fast food burgers, tacos, fried chicken, pizza, hot dogs, french fries)  Saturated fat comes in the form of butter, lard, coconut oil, margarine, partially hydrogenated oils, and fat in meat. These increase your risk of cardiovascular disease.  Use healthy plant oils, such as olive, canola, soy, corn, sunflower and peanut.  Whole foods such as fruits, vegetables and whole grains have fiber  Men need > 38 grams of fiber per day Women need > 25 grams of fiber per day  Load up on vegetables and fruits - one-half of your plate: Aim for color and variety, and remember that potatoes dont count. Go for whole grains - one-quarter of your plate: Whole wheat, barley, wheat berries, quinoa, oats, brown rice, and foods made with them. If you want pasta, go with whole wheat pasta. Protein power - one-quarter of your plate: Fish, chicken, beans, and nuts are all healthy, versatile protein sources. Limit red meat. You need carbohydrates for energy! The type of carbohydrate is more important than the amount. Choose carbohydrates such as vegetables, fruits, whole grains, beans, and nuts in the place of white rice, white pasta, potatoes (baked or fried), macaroni and cheese, cakes, cookies, and donuts.  If youre thirsty, drink water. Coffee and tea are good in moderation, but skip sugary drinks and limit milk and dairy products to one or two daily  servings. Keep sugar intake at 6 teaspoons or 24 grams or LESS       Exercise: Aim for 150 min of moderate intensity exercise weekly for heart health, and weights twice weekly for bone health Stay active - any steps are better than no steps! Aim for 7-9 hours of sleep daily      Mediterranean Diet A Mediterranean diet refers to food and lifestyle choices that are based on the traditions of countries located on the [employer]. It focuses on eating more fruits,  vegetables, whole grains, beans, nuts, seeds, and heart-healthy fats, and eating less dairy, meat, eggs, and processed foods with added sugar, salt, and fat. This way of eating has been shown to help prevent certain conditions and improve outcomes for people who have chronic diseases, like kidney disease and heart disease. What are tips for following this plan? Reading food labels Check the serving size of packaged foods. For foods such as rice and pasta, the serving size refers to the amount of cooked product, not dry. Check the total fat in packaged foods. Avoid foods that have saturated fat or trans fats. Check the ingredient list for added sugars, such as corn syrup. Shopping  Buy a variety of foods that offer a balanced diet, including: Fresh fruits and vegetables (produce). Grains, beans, nuts, and seeds. Some of these may be available in unpackaged forms or large amounts (in bulk). Fresh seafood. Poultry and eggs. Low-fat dairy products. Buy whole ingredients instead of prepackaged foods. Buy fresh fruits and vegetables in-season from local farmers markets. Buy plain frozen fruits and vegetables. If you do not have access to quality fresh seafood, buy precooked frozen shrimp or canned fish, such as tuna, salmon, or sardines. Stock your pantry so you always have certain foods on hand, such as olive oil, canned tuna, canned tomatoes, rice, pasta, and beans. Cooking Cook foods with extra-virgin olive oil instead of using butter or other vegetable oils. Have meat as a side dish, and have vegetables or grains as your main dish. This means having meat in small portions or adding small amounts of meat to foods like pasta or stew. Use beans or vegetables instead of meat in common dishes like chili or lasagna. Experiment with different cooking methods. Try roasting, broiling, steaming, and sauting vegetables. Add frozen vegetables to soups, stews, pasta, or rice. Add nuts or seeds for added  healthy fats and plant protein at each meal. You can add these to yogurt, salads, or vegetable dishes. Marinate fish or vegetables using olive oil, lemon juice, garlic, and fresh herbs. Meal planning Plan to eat one vegetarian meal one day each week. Try to work up to two vegetarian meals, if possible. Eat seafood two or more times a week. Have healthy snacks readily available, such as: Vegetable sticks with hummus. Greek yogurt. Fruit and nut trail mix. Eat balanced meals throughout the week. This includes: Fruit: 2-3 servings a day. Vegetables: 4-5 servings a day. Low-fat dairy: 2 servings a day. Fish, poultry, or lean meat: 1 serving a day. Beans and legumes: 2 or more servings a week. Nuts and seeds: 1-2 servings a day. Whole grains: 6-8 servings a day. Extra-virgin olive oil: 3-4 servings a day. Limit red meat and sweets to only a few servings a month. Lifestyle  Cook and eat meals together with your family, when possible. Drink enough fluid to keep your urine pale yellow. Be physically active every day. This includes: Aerobic exercise like running or swimming. Leisure activities like  gardening, walking, or housework. Get 7-8 hours of sleep each night. If recommended by your health care provider, drink red wine in moderation. This means 1 glass a day for nonpregnant women and 2 glasses a day for men. A glass of wine equals 5 oz (150 mL). What foods should I eat? Fruits Apples. Apricots. Avocado. Berries. Bananas. Cherries. Dates. Figs. Grapes. Lemons. Melon. Oranges. Peaches. Plums. Pomegranate. Vegetables Artichokes. Beets. Broccoli. Cabbage. Carrots. Eggplant. Green beans. Chard. Kale. Spinach. Onions. Leeks. Peas. Squash. Tomatoes. Peppers. Radishes. Grains Whole-grain pasta. Brown rice. Bulgur wheat. Polenta. Couscous. Whole-wheat bread. Orpah Cobb. Meats and other proteins Beans. Almonds. Sunflower seeds. Pine nuts. Peanuts. Cod. Salmon. Scallops. Shrimp. Tuna.  Tilapia. Clams. Oysters. Eggs. Poultry without skin. Dairy Low-fat milk. Cheese. Greek yogurt. Fats and oils Extra-virgin olive oil. Avocado oil. Grapeseed oil. Beverages Water. Red wine. Herbal tea. Sweets and desserts Greek yogurt with honey. Baked apples. Poached pears. Trail mix. Seasonings and condiments Basil. Cilantro. Coriander. Cumin. Mint. Parsley. Sage. Rosemary. Tarragon. Garlic. Oregano. Thyme. Pepper. Balsamic vinegar. Tahini. Hummus. Tomato sauce. Olives. Mushrooms. The items listed above may not be a complete list of foods and beverages you can eat. Contact a dietitian for more information. What foods should I limit? This is a list of foods that should be eaten rarely or only on special occasions. Fruits Fruit canned in syrup. Vegetables Deep-fried potatoes (french fries). Grains Prepackaged pasta or rice dishes. Prepackaged cereal with added sugar. Prepackaged snacks with added sugar. Meats and other proteins Beef. Pork. Lamb. Poultry with skin. Hot dogs. Tomasa Blase. Dairy Ice cream. Sour cream. Whole milk. Fats and oils Butter. Canola oil. Vegetable oil. Beef fat (tallow). Lard. Beverages Juice. Sugar-sweetened soft drinks. Beer. Liquor and spirits. Sweets and desserts Cookies. Cakes. Pies. Candy. Seasonings and condiments Mayonnaise. Pre-made sauces and marinades. The items listed above may not be a complete list of foods and beverages you should limit. Contact a dietitian for more information. Summary The Mediterranean diet includes both food and lifestyle choices. Eat a variety of fresh fruits and vegetables, beans, nuts, seeds, and whole grains. Limit the amount of red meat and sweets that you eat. If recommended by your health care provider, drink red wine in moderation. This means 1 glass a day for nonpregnant women and 2 glasses a day for men. A glass of wine equals 5 oz (150 mL). This information is not intended to replace advice given to you by your health  care provider. Make sure you discuss any questions you have with your health care provider. Document Revised: 06/01/2019 Document Reviewed: 03/29/2019 Elsevier Patient Education  2023 [employer].

## 2022-11-15 ENCOUNTER — Other Ambulatory Visit (HOSPITAL_COMMUNITY): Payer: Self-pay

## 2022-11-16 ENCOUNTER — Other Ambulatory Visit (HOSPITAL_COMMUNITY): Payer: Self-pay

## 2022-11-16 ENCOUNTER — Ambulatory Visit: Payer: 59 | Attending: Cardiology

## 2022-11-16 DIAGNOSIS — E782 Mixed hyperlipidemia: Secondary | ICD-10-CM

## 2022-11-17 LAB — COMPREHENSIVE METABOLIC PANEL
ALT: 33 IU/L (ref 0–44)
AST: 21 IU/L (ref 0–40)
Albumin: 4.3 g/dL (ref 3.9–4.9)
Alkaline Phosphatase: 72 IU/L (ref 44–121)
BUN/Creatinine Ratio: 12 (ref 10–24)
BUN: 13 mg/dL (ref 8–27)
Bilirubin Total: 0.3 mg/dL (ref 0.0–1.2)
CO2: 26 mmol/L (ref 20–29)
Calcium: 9.3 mg/dL (ref 8.6–10.2)
Chloride: 106 mmol/L (ref 96–106)
Creatinine, Ser: 1.12 mg/dL (ref 0.76–1.27)
Globulin, Total: 2.6 g/dL (ref 1.5–4.5)
Glucose: 114 mg/dL — ABNORMAL HIGH (ref 70–99)
Potassium: 4 mmol/L (ref 3.5–5.2)
Sodium: 144 mmol/L (ref 134–144)
Total Protein: 6.9 g/dL (ref 6.0–8.5)
eGFR: 74 mL/min/{1.73_m2} (ref >59)

## 2022-11-17 LAB — HEMOGLOBIN A1C
Est. average glucose Bld gHb Est-mCnc: 134 mg/dL
Hgb A1c MFr Bld: 6.3 % — ABNORMAL HIGH (ref 4.8–5.6)

## 2022-11-17 LAB — LIPID PANEL
Chol/HDL Ratio: 2.7 ratio (ref 0.0–5.0)
Cholesterol, Total: 111 mg/dL (ref 100–199)
HDL: 41 mg/dL (ref >39)
LDL Chol Calc (NIH): 50 mg/dL (ref 0–99)
Triglycerides: 106 mg/dL (ref 0–149)
VLDL Cholesterol Cal: 20 mg/dL (ref 5–40)

## 2023-04-13 ENCOUNTER — Other Ambulatory Visit (INDEPENDENT_AMBULATORY_CARE_PROVIDER_SITE_OTHER): Payer: Self-pay

## 2023-04-13 ENCOUNTER — Encounter: Payer: Self-pay | Admitting: Orthopedic Surgery

## 2023-04-13 ENCOUNTER — Ambulatory Visit: Payer: 59 | Admitting: Orthopedic Surgery

## 2023-04-13 DIAGNOSIS — M79672 Pain in left foot: Secondary | ICD-10-CM

## 2023-04-13 DIAGNOSIS — M79671 Pain in right foot: Secondary | ICD-10-CM

## 2023-04-13 NOTE — Progress Notes (Signed)
Office Visit Note   Patient: Ruben Simmons           Date of Birth: [birth year]           MRN: [id number] Visit Date: 04/13/2023 Requested by: Richmond Campbell., PA-C [address],  [place] [address] PCP: Richmond Campbell., PA-C  Subjective: Chief Complaint  Patient presents with   Other    Bilateral foot pain     HPI: Ruben Simmons is a 62 y.o. male who presents to the office reporting bilateral foot pain.  Patient reports bilateral toe fractures in each foot in January of this year.  He states that he is "walking weird".  Reports mostly pain at night from the dorsal foot radiating upward.  The pain really starts at the great toe on the left and on toes #2 and 3 on the right and radiates up the anterior leg.  Flat shoes have helped him at night.  Reports some back pain.  He has been taking durable heart for high cholesterol..                ROS: All systems reviewed are negative as they relate to the chief complaint within the history of present illness.  Patient denies fevers or chills.  Assessment & Plan: Visit Diagnoses:  1. 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  Could also be early the neuropathy although he is not having as much burning as he is radiating pain.  Does not seem like it is coming from the back.  We talked about shoewear modification.  He feels like he is supinating a lot when he is walking.  He is going to get some more stable type shoes and see how that works.  If not then we could consider nerve study to confirm early the neuropathy.  Follow-Up Instructions: No follow-ups on file.   Orders:  Orders Placed This Encounter  Procedures   XR Foot Complete Right   XR Foot Complete Left   No orders of the defined types were placed in this encounter.     Procedures: No procedures performed   Clinical Data: No additional  findings.  Objective: Vital Signs: There were no vitals taken for this visit.  Physical Exam:  Constitutional: Patient appears well-developed HEENT:  Head: Normocephalic Eyes:EOM are normal Neck: Normal range of motion Cardiovascular: Normal rate Pulmonary/chest: Effort normal Neurologic: Patient is alert Skin: Skin is warm Psychiatric: Patient has normal mood and affect  Ortho Exam: Ortho exam demonstrates normal gait alignment.  Palpable pedal pulses in both feet.  Palpable intact nontender anterior to posterior to peroneal and Achilles tendons.  No masses lymphadenopathy or skin changes noted in either foot region.  Toes are mildly tender to palpation on the left great toe and right toes 2 and 3.  Specialty Comments:  No specialty comments available.  Imaging: XR Foot Complete Left  Result Date: 04/13/2023 AP lateral oblique radiographs left foot reviewed.  Tarsometatarsal alignment intact.  No acute fracture.  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  MTP joints appear spared.  XR Foot Complete Right  Result Date: 04/13/2023 AP lateral merchant radiographs right foot reviewed.  No acute fracture.  Tarsometatarsal alignment intact.  Some arthritis is present in the phalangeal joints of the toes.  MTP joints look reasonable.    PMFS History:  There are no problems to display for this patient.  Past Medical History:  Diagnosis Date   GERD (gastroesophageal reflux disease)    Hypertension     Family History  Problem Relation Age of Onset   Parkinson's disease Father     History reviewed. No pertinent surgical history. Social History   Occupational History   Not on file  Tobacco Use   Smoking status: Never   Smokeless tobacco: Never  Vaping Use   Vaping status: Never Used  Substance and Sexual Activity   Alcohol use: Yes    Comment: Occassionally   Drug use: Not Currently   Sexual activity: Not on file

## 2023-05-23 ENCOUNTER — Telehealth: Payer: Self-pay | Admitting: Cardiovascular Disease

## 2023-05-23 NOTE — Telephone Encounter (Signed)
 Pt c/o BP issue: STAT if pt c/o blurred vision, one-sided weakness or slurred speech  1. What are your last 5 BP readings?  139/83 128/90 125/92 147/98 156/98 165/98 169/104 this morning  2. Are you having any other symptoms (ex. Dizziness, headache, blurred vision, passed out)? Patient states his having dizziness and headache.  Patient states at night he is having nerve pain at the end of his feet and fingers.    3. What is your BP issue? BP running high.    I did schedule him a f/u visit appt with Dr. Alveta for tomorrow (1/14)

## 2023-05-24 ENCOUNTER — Ambulatory Visit: Payer: 59 | Attending: Cardiovascular Disease | Admitting: Cardiovascular Disease

## 2023-05-24 VITALS — BP 138/86 | HR 94 | Ht 71.0 in | Wt 248.2 lb

## 2023-05-24 DIAGNOSIS — R252 Cramp and spasm: Secondary | ICD-10-CM

## 2023-05-24 DIAGNOSIS — I10 Essential (primary) hypertension: Secondary | ICD-10-CM | POA: Diagnosis not present

## 2023-05-24 DIAGNOSIS — Z79899 Other long term (current) drug therapy: Secondary | ICD-10-CM

## 2023-05-24 DIAGNOSIS — R6889 Other general symptoms and signs: Secondary | ICD-10-CM | POA: Diagnosis not present

## 2023-05-24 DIAGNOSIS — F419 Anxiety disorder, unspecified: Secondary | ICD-10-CM | POA: Diagnosis not present

## 2023-05-24 NOTE — Patient Instructions (Signed)
 Lab Work: Lipids, ALT, BMET, TSH today If you have labs (blood work) drawn today and your tests are completely normal, you will receive your results only by: MyChart Message (if you have MyChart) OR A paper copy in the mail If you have any lab test that is abnormal or we need to change your treatment, we will call you to review the results.  Follow-Up: At [facility], you and your health needs are our priority.  As part of our continuing mission to provide you with exceptional heart care, we have created designated Provider Care Teams.  These Care Teams include your primary Cardiologist (physician) and Advanced Practice Providers (APPs -  Physician Assistants and Nurse Practitioners) who all work together to provide you with the care you need, when you need it.  Your next appointment:   1 year(s)  Provider:   Vinie Maxcy, MD     1st Floor: - Lobby - Registration  - Pharmacy  - Lab - Cafe  2nd Floor: - PV Lab - Diagnostic Testing (echo, CT, nuclear med)  3rd Floor: - Vacant  4th Floor: - TCTS (cardiothoracic surgery) - AFib Clinic - Structural Heart Clinic - Vascular Surgery  - Vascular Ultrasound  5th Floor: - [facility]Care Cardiology (general and EP) - Clinical Pharmacy for coumadin, hypertension, lipid, weight-loss medications, and med management appointments    Valet parking services will be available as well.

## 2023-05-24 NOTE — Progress Notes (Signed)
 Cardiology Office Note:    Date:  05/24/2023   ID:  Ruben Simmons, DOB [birth year], MRN [id number]  PCP:  Debrah Josette ORN., PA-C   [facility] Providers Cardiologist:  Alveta Finn to update primary MD,subspecialty MD or APP then REFRESH:1}    Referring MD: Debrah Josette ORN., PA-C   No chief complaint on file.   November 24, 2020:    Ruben Simmons is a 63 y.o. male with a hx of HTN.  We were asked to see him by Dr. Linnie for further evalutaion of his HTN  Friend of [employer].  Ruben Simmons is seen today for further management of his hypertension. Owns an advertising account planner  Is also into real estate   Diet is poor Walks the dogs on occasion -  1-1.5 miles , frequent stops  Is renovating a house at [facility]. Just sold his the [employer] company ,  has gone back to work  [employer] his company this past Dec.   No regular exercise,  Eats out every day for lunch ,  Goes out for dinner frequently .   Labs from his primary medical doctor's office reveals relatively normal CBC.  His hemoglobin is 14.8.  Platelet count is 230.  White blood cell count is 6.3.  PSA 0.44.  Lipid profile reveals LDL is 139 Triglyceride level is 177 HDL is 33 Total cholesterol is 192  Hemoglobin A1c is 5.9  Complete metabolic profile reveals a potassium of 3.8.  Glucose is 98. Creatinine 1.15    Liver enzymes are within normal limits. Has tried statins but couild not tolerate any of them   Oct. 25, 2022: Ruben Simmons is seen today for follow up of his HTN, HLD    Bp is well controlled  Wt is 242 lbs. ( Down 3 lbs)   We discussed his ASA therapy.  Is intolerent to statins .   September 04, 2021: Ruben Simmons is seen today for follow-up of his hypertension and hyperlipidemia. His weight today is 240 pounds which is down 2 pounds from his last visit.  Harvest is seen today for follow-up of his hypertension and hyperlipidemia.  His weight is 240 pounds which is down 2 pounds since his last visit. Blood pressure is well  controlled.  Has not tolerated statins  LDL was 137 Started Repatha  today    May 24, 2023 Ruben Simmons is seen for follow up of his HTN and HLD  Wt is 248 ( up 8 lbs from last visit )   Has not been exercising   He is retiring in 2 weeks      Past Medical History:  Diagnosis Date   GERD (gastroesophageal reflux disease)    Hypertension     No past surgical history on file.  Current Medications: Current Meds  Medication Sig   esomeprazole (NEXIUM) 20 MG capsule Take 20 mg by mouth daily at 12 noon.   Evolocumab  (REPATHA  SURECLICK) 140 MG/ML SOAJ Inject 140 mg into the skin every 14 (fourteen) days.   hydrochlorothiazide  (MICROZIDE ) 12.5 MG capsule Take 1 capsule (12.5 mg total) by mouth daily.   losartan  (COZAAR ) 50 MG tablet Take 1 tablet (50 mg total) by mouth every morning.   potassium chloride  (KLOR-CON ) 10 MEQ tablet Take 1 tablet (10 mEq total) by mouth 2 (two) times daily.     Allergies:   Dexamethasone , Penicillin g, Penicillins, Statins, and Prednisone   Social History   Socioeconomic History   Marital status: Married    Spouse name:  Not on file   Number of children: Not on file   Years of education: Not on file   Highest education level: Not on file  Occupational History   Not on file  Tobacco Use   Smoking status: Never   Smokeless tobacco: Never  Vaping Use   Vaping status: Never Used  Substance and Sexual Activity   Alcohol use: Yes    Comment: Occassionally   Drug use: Not Currently   Sexual activity: Not on file  Other Topics Concern   Not on file  Social History Narrative   Not on file   Social Drivers of Health   Financial Resource Strain: Not on file  Food Insecurity: Not on file  Transportation Needs: Not on file  Physical Activity: Not on file  Stress: Not on file  Social Connections: Unknown (09/22/2021)   Received from [facility]   Social Network    Social Network: Not on file     Family History: The patient's  family history includes Parkinson's disease in his father.  ROS:   Please see the history of present illness.     All other systems reviewed and are negative.  EKGs/Labs/Other Studies Reviewed:    The following studies were reviewed today:   EKG:  EKG Interpretation Date/Time:  Tuesday May 24 2023 16:02:42 EST Ventricular Rate:  90 PR Interval:  154 QRS Duration:  100 QT Interval:  350 QTC Calculation: 428 R Axis:   5  Text Interpretation: Normal sinus rhythm Nonspecific ST abnormality When compared with ECG of 01-Jun-2022 17:48, No significant change since last tracing Confirmed by Alveta Mungo [phone]) on 05/24/2023 4:11:31 PM     Recent Labs: 06/01/2022: Hemoglobin 14.7; Platelets 213 11/16/2022: ALT 33; BUN 13; Creatinine, Ser 1.12; Potassium 4.0; Sodium 144  Recent Lipid Panel    Component Value Date/Time   CHOL 111 11/16/2022 1046   TRIG 106 11/16/2022 1046   HDL 41 11/16/2022 1046   CHOLHDL 2.7 11/16/2022 1046   LDLCALC 50 11/16/2022 1046     Risk Assessment/Calculations:           Physical Exam:    Physical Exam: Blood pressure 138/86, pulse 94, height 5' 11 (1.803 m), weight 248 lb 3.2 oz (112.6 kg), SpO2 98%.     GEN:  moderately obese male,  in no acute distress HEENT: Normal NECK: No JVD; No carotid bruits LYMPHATICS: No lymphadenopathy CARDIAC: RRR , no murmurs, rubs, gallops,  his radial pulses are excellent and his dorsalis pedis pulses are also excellent  RESPIRATORY:  Clear to auscultation without rales, wheezing or rhonchi  ABDOMEN: obese, distended  MUSCULOSKELETAL:  No edema; No deformity  SKIN: Warm and dry NEUROLOGIC:  Alert and oriented x 3    ASSESSMENT:    1. Primary hypertension      PLAN:     Hypertension: Blood pressure has been normal.  I strongly advised him to work on improved diet, exercise, weight loss plan.  This will greatly help with control of his blood pressure.  He has noticed that when he is under  severe stress or in pain that his blood pressure will shoot up.  I told him that this was normal physiology and that he should not be measuring his blood pressure during his intense anxiety or when he is in significant pain.    2.  Hyperlipidemia:  He has read the package insert for Repatha  and it states that it is associated with diabetes.  I  have messaged Melissa.  He would like to try Praluent .  3.  Shooting pain in his legs: He has very unusual shooting pains in his legs.  These typically occur in the middle of the night.  I do not have an explanation for them.  He has excellent pulses in both legs and both hands.  I do not think that this is a circulatory issue.  I have asked him to consult with his primary medical doctor.          Medication Adjustments/Labs and Tests Ordered: Current medicines are reviewed at length with the patient today.  Concerns regarding medicines are outlined above.  Orders Placed This Encounter  Procedures   EKG 12-Lead     No orders of the defined types were placed in this encounter.      There are no Patient Instructions on file for this visit.   Signed, Aleene Passe, MD  05/24/2023 4:11 PM     [facility]

## 2023-05-24 NOTE — Telephone Encounter (Signed)
 Seen in clinic with Ruben Simmons 05/24/23 and addressed.

## 2023-05-25 ENCOUNTER — Other Ambulatory Visit (HOSPITAL_COMMUNITY): Payer: Self-pay

## 2023-05-26 ENCOUNTER — Other Ambulatory Visit (HOSPITAL_COMMUNITY): Payer: Self-pay

## 2023-05-26 ENCOUNTER — Telehealth: Payer: Self-pay | Admitting: Pharmacy Technician

## 2023-05-26 NOTE — Telephone Encounter (Signed)
Pharmacy Patient Advocate Encounter   Received notification from Pt Calls Messages that prior authorization for Praluent 75mg /ml is required/requested.   Insurance verification completed.   The patient is insured through [employer] .   Per test claim: PA required; PA submitted to above mentioned insurance via CoverMyMeds Key/confirmation #/EOC [id number] Status is pending

## 2023-05-26 NOTE — Telephone Encounter (Signed)
PA request has been Submitted. New Encounter created for follow up. For additional info see Pharmacy Prior Auth telephone encounter from 05/26/23.   Olene Floss, RPH-CPP  Willette Cluster, CPhT Yes please       Previous Messages    ----- Message ----- From: Willette Cluster, CPhT Sent: 05/25/2023  11:28 AM EST To: Olene Floss, RPH-CPP  Pharmacy Patient Advocate Encounter   Received notification from Staff Messages that prior authorization for Praluent is required/requested.   Insurance verification completed.   The patient is insured through [employer] .   Per test claim: PA required  Would you like for me to start a prior authorization? ----- Message ----- From: Olene Floss, RPH-CPP Sent: 05/24/2023   5:00 PM EST To: Olene Floss, RPH-CPP; Rx Prior Auth Team  Please see if insurance will cover praluent Pt concerned about repatha causing DM

## 2023-05-27 ENCOUNTER — Ambulatory Visit: Payer: Self-pay | Admitting: Family Medicine

## 2023-05-27 LAB — LIPID PANEL
Chol/HDL Ratio: 5 {ratio} (ref 0.0–5.0)
Cholesterol, Total: 135 mg/dL (ref 100–199)
HDL: 27 mg/dL — ABNORMAL LOW (ref >39)
LDL Chol Calc (NIH): 86 mg/dL (ref 0–99)
Triglycerides: 118 mg/dL (ref 0–149)
VLDL Cholesterol Cal: 22 mg/dL (ref 5–40)

## 2023-05-27 LAB — BASIC METABOLIC PANEL
BUN/Creatinine Ratio: 15 (ref 10–24)
BUN: 16 mg/dL (ref 8–27)
CO2: 16 mmol/L — ABNORMAL LOW (ref 20–29)
Calcium: 8.8 mg/dL (ref 8.6–10.2)
Chloride: 109 mmol/L — ABNORMAL HIGH (ref 96–106)
Creatinine, Ser: 1.05 mg/dL (ref 0.76–1.27)
Glucose: 128 mg/dL — ABNORMAL HIGH (ref 70–99)
Potassium: 4.2 mmol/L (ref 3.5–5.2)
Sodium: 145 mmol/L — ABNORMAL HIGH (ref 134–144)
eGFR: 79 mL/min/{1.73_m2} (ref >59)

## 2023-05-27 LAB — ALT: ALT: 29 [IU]/L (ref 0–44)

## 2023-05-27 NOTE — Telephone Encounter (Signed)
  Chief Complaint: information only Additional Notes: Patient call to review lab results. This RN gave lab results from 05/26/23.  Vesta Mixer, MD 05/27/2023  9:17 AM EST Back to Top    Lipids remain stable .   HDL remains low .   Glucose remains elevated   He would benefit from more cardio exercise   Patient  verbalized understanding. Reports he is trying to establish PCP care at [facility]. Of note, pt already has appt scheduled on 06/07/23. Transferred to Agent to reschedule if available.   Copied from CRM [id number]. Topic: Clinical - Red Word Triage >> May 27, 2023  4:07 PM Drema Balzarine wrote: Red Word that prompted transfer to Nurse Triage: Patient wants to discuss abnormal lab results from yesterday Answer Assessment - Initial Assessment Questions 1. REASON FOR CALL or QUESTION: "What is your reason for calling today?" or "How can I best help you?" or "What question do you have that I can help answer?"     Lab review  Protocols used: Information Only Call - No Triage-A-AH

## 2023-05-31 ENCOUNTER — Other Ambulatory Visit (HOSPITAL_COMMUNITY): Payer: Self-pay

## 2023-06-01 ENCOUNTER — Other Ambulatory Visit (HOSPITAL_COMMUNITY): Payer: Self-pay

## 2023-06-01 NOTE — Telephone Encounter (Signed)
Pharmacy Patient Advocate Encounter  Received notification from  priority health  that Prior Authorization for Praluent 75mg /ml has been APPROVED from 05/31/23 to 06/09/28. Ran test claim, Copay is $80.00. This test claim was processed through [facility]- copay amounts may vary at other pharmacies due to pharmacy/plan contracts, or as the patient moves through the different stages of their insurance plan.   PA #/Case ID/Reference #: [id number]

## 2023-06-02 ENCOUNTER — Encounter: Payer: Self-pay | Admitting: Pharmacist

## 2023-06-02 MED ORDER — PRALUENT 75 MG/ML ~~LOC~~ SOAJ: 75.0000 mg | SUBCUTANEOUS | 11 refills | Status: AC

## 2023-06-02 NOTE — Addendum Note (Signed)
Addended by: Malena Peer D on: 06/02/2023 09:29 AM   Modules accepted: Orders

## 2023-06-02 NOTE — Telephone Encounter (Signed)
I spoke to patient about approval of Praluent.  He would like prescription sent to [facility] in [place].  Will start at 75 mg of Praluent every 14 days and increase if needed.  Advised he can get a co-pay card online.  Requested that I send the name of the drug to his MyChart.  Patient appreciative of the help.  We reviewed the dosing and injection again.  Advised that it is the same pen device and frequency as his Repatha.

## 2023-06-06 ENCOUNTER — Telehealth: Payer: Self-pay | Admitting: Family Medicine

## 2023-06-06 NOTE — Telephone Encounter (Signed)
Please diregard, pt is transferring from Dr.Michael to Hyman Hopes not Alfredia Ferguson.

## 2023-06-06 NOTE — Telephone Encounter (Signed)
I'm not sure how this patient is listed under me, I have never seen him before and he has never been seen by a [place] physician before either. I'm just documenting this in the task just in case.

## 2023-06-06 NOTE — Telephone Encounter (Signed)
FYI: Pt has a TOC appt on 06/07/23 to transfer care from Dr.Michael to Alfredia Ferguson.

## 2023-06-06 NOTE — Telephone Encounter (Signed)
That's fine.  Thank you.

## 2023-06-07 ENCOUNTER — Encounter: Payer: 59 | Admitting: Family Medicine

## 2023-06-07 DIAGNOSIS — Z0000 Encounter for general adult medical examination without abnormal findings: Secondary | ICD-10-CM

## 2023-06-15 ENCOUNTER — Ambulatory Visit: Payer: 59 | Admitting: Family Medicine

## 2023-06-15 ENCOUNTER — Other Ambulatory Visit: Payer: Self-pay | Admitting: Nurse Practitioner

## 2023-06-15 DIAGNOSIS — Z0000 Encounter for general adult medical examination without abnormal findings: Secondary | ICD-10-CM

## 2023-06-22 ENCOUNTER — Ambulatory Visit: Payer: 59 | Admitting: Family Medicine

## 2023-07-20 ENCOUNTER — Telehealth: Payer: Self-pay | Admitting: Pharmacy Technician

## 2023-07-20 NOTE — Telephone Encounter (Signed)
 [facility] to do a [facility] for repatha but the pt was chgd to praulent in jan. There was a pa approved in [place] and pt received info to get a coupon for praluent. I will call [facility] to see why we got the repatha pa req.     [facility] and [facility] are [facility] pr[facility]uent

## 2023-08-26 ENCOUNTER — Other Ambulatory Visit: Payer: Self-pay | Admitting: Nurse Practitioner

## 2023-08-26 DIAGNOSIS — Z79899 Other long term (current) drug therapy: Secondary | ICD-10-CM

## 2023-08-26 DIAGNOSIS — E782 Mixed hyperlipidemia: Secondary | ICD-10-CM

## 2023-08-26 DIAGNOSIS — I10 Essential (primary) hypertension: Secondary | ICD-10-CM

## 2023-09-06 ENCOUNTER — Telehealth: Payer: Self-pay | Admitting: Pharmacy Technician

## 2023-09-06 NOTE — Telephone Encounter (Signed)
 We [facility] to do a [facility] for repatha  but the pt was chgd to praulent in jan. There was a pa approved in [place] and pt received info to get a coupon for praluent . I will call [facility] to see why we got the repatha  pa req.        [facility] and [facility] are [facility]uent   This also happened in march. I spoke to the rph and she said she made a note to stop the repatha 

## 2024-02-07 ENCOUNTER — Emergency Department (HOSPITAL_BASED_OUTPATIENT_CLINIC_OR_DEPARTMENT_OTHER)

## 2024-02-07 ENCOUNTER — Other Ambulatory Visit: Payer: Self-pay

## 2024-02-07 ENCOUNTER — Emergency Department (HOSPITAL_BASED_OUTPATIENT_CLINIC_OR_DEPARTMENT_OTHER)
Admission: EM | Admit: 2024-02-07 | Discharge: 2024-02-07 | Disposition: A | Discharge status: Home / Self Care | Attending: Emergency Medicine | Admitting: Emergency Medicine

## 2024-02-07 DIAGNOSIS — S61213A Laceration without foreign body of left middle finger without damage to nail, initial encounter: Secondary | ICD-10-CM | POA: Insufficient documentation

## 2024-02-07 DIAGNOSIS — S6710XA Crushing injury of unspecified finger(s), initial encounter: Secondary | ICD-10-CM

## 2024-02-07 DIAGNOSIS — Z23 Encounter for immunization: Secondary | ICD-10-CM | POA: Diagnosis not present

## 2024-02-07 DIAGNOSIS — W230XXA Caught, crushed, jammed, or pinched between moving objects, initial encounter: Secondary | ICD-10-CM | POA: Insufficient documentation

## 2024-02-07 DIAGNOSIS — S67193A Crushing injury of left middle finger, initial encounter: Secondary | ICD-10-CM | POA: Diagnosis present

## 2024-02-07 DIAGNOSIS — S61313A Laceration without foreign body of left middle finger with damage to nail, initial encounter: Secondary | ICD-10-CM

## 2024-02-07 MED ORDER — LIDOCAINE HCL (PF) 1 % IJ SOLN: 5.0000 mL | Freq: Once | INTRAMUSCULAR | Status: DC

## 2024-02-07 MED ORDER — HYDROCODONE-ACETAMINOPHEN 5-325 MG PO TABS
1.0000 | ORAL_TABLET | Freq: Once | ORAL | Status: AC
  Administered 2024-02-07: 1 via ORAL
  Filled 2024-02-07: qty 1

## 2024-02-07 MED ORDER — LIDOCAINE HCL (PF) 1 % IJ SOLN
INTRAMUSCULAR | Status: AC
  Filled 2024-02-07: qty 5

## 2024-02-07 MED ORDER — OXYCODONE-ACETAMINOPHEN 5-325 MG PO TABS: 1.0000 | ORAL_TABLET | Freq: Four times a day (QID) | ORAL | 0 refills | Status: DC | PRN

## 2024-02-07 MED ORDER — CEFUROXIME AXETIL 500 MG PO TABS: 500.0000 mg | ORAL_TABLET | Freq: Two times a day (BID) | ORAL | 0 refills | Status: AC

## 2024-02-07 MED ORDER — CEFUROXIME AXETIL 250 MG PO TABS: 500.0000 mg | ORAL_TABLET | Freq: Two times a day (BID) | ORAL | Status: DC

## 2024-02-07 MED ORDER — TETANUS-DIPHTH-ACELL PERTUSSIS 5-2.5-18.5 LF-MCG/0.5 IM SUSY
0.5000 mL | PREFILLED_SYRINGE | Freq: Once | INTRAMUSCULAR | Status: AC
  Administered 2024-02-07: 0.5 mL via INTRAMUSCULAR
  Filled 2024-02-07: qty 0.5

## 2024-02-07 NOTE — ED Provider Notes (Signed)
 LACERATION REPAIR Performed by: Merl Overland Authorized by: Cleven Jansma Consent: Verbal consent obtained. Risks and benefits: risks, benefits and alternatives were discussed Consent given by: patient Patient identity confirmed: provided demographic data Prepped and Draped in normal sterile fashion Wound explored  Laceration Location: left middle finger  Laceration Length: 2 cm  No Foreign Bodies seen or palpated  Anesthesia: local infiltration  Local anesthetic: lidocaine 1% without epinephrine  Anesthetic total: 3 ml  Irrigation method: syringe Amount of cleaning: standard  Skin closure: closed   Number of sutures: 3  Technique: simple interrupted   Patient tolerance: Patient tolerated the procedure well with no immediate complications.    Madalene Mickler, PA-C 02/07/24 1856    Mannie Pac T, DO 02/08/24 1626

## 2024-02-07 NOTE — ED Provider Notes (Addendum)
 [place] EMERGENCY DEPARTMENT AT [facility] Provider Note   CSN: [id number] Arrival date & time: 02/07/24  8195     Patient presents with: Finger Injury   Ruben Simmons is a 63 y.o. male.   This is a 63 year old male who is here today after he smashed his finger in between 2 logs.  Patient crushed his left middle finger.  He is right-hand dominant.        Prior to Admission medications   Medication Sig Start Date End Date Taking? Authorizing Provider  Alirocumab  (PRALUENT ) 75 MG/ML SOAJ Inject 1 mL (75 mg total) into the skin every 14 (fourteen) days. 06/02/23   Nahser, Aleene PARAS, MD  esomeprazole (NEXIUM) 20 MG capsule Take 20 mg by mouth daily at 12 noon.    [provider]  hydrochlorothiazide  (MICROZIDE ) 12.5 MG capsule TAKE 1 CAPSULE BY MOUTH EVERY DAY 06/16/23   Nahser, Aleene PARAS, MD  losartan  (COZAAR ) 25 MG tablet TAKE 1 TABLET BY MOUTH EVERYDAY AT BEDTIME 06/16/23   Nahser, Aleene PARAS, MD  losartan  (COZAAR ) 50 MG tablet TAKE 1 TABLET BY MOUTH EVERY MORNING 06/16/23   Nahser, Aleene PARAS, MD  potassium chloride  (KLOR-CON ) 10 MEQ tablet TAKE 1 TABLET BY MOUTH 2 TIMES DAILY. 08/26/23   Swinyer, Rosaline HERO, NP    Allergies: Dexamethasone, Penicillin g, Penicillins, Statins, and Prednisone    Review of Systems  Updated Vital Signs BP (!) 139/94 (BP Location: Right Arm)   Pulse 60   Temp (!) 97.5 F (36.4 C)   Resp 15   Ht 5' 11 (1.803 m)   Wt 103.4 kg   SpO2 94%   BMI 31.80 kg/m   Physical Exam Vitals and nursing note reviewed.  Musculoskeletal:        General: Normal range of motion.     Comments: There is a 2 cm laceration on the ulnar side of the patient's left index finger, nailbed spared.  Small subungual hematoma making up less than 15% of his nail.  Intact FDS and FDP function.    Neurological:     Mental Status: He is alert.     Comments: Normal sensation in the left middle finger.     (all labs ordered are listed, but only abnormal results  are displayed) Labs Reviewed - No data to display  EKG: None  Radiology: No results found.   Procedures   Medications Ordered in the ED  lidocaine (PF) (XYLOCAINE) 1 % injection 5 mL (has no administration in time range)  lidocaine (PF) (XYLOCAINE) 1 % injection (has no administration in time range)  HYDROcodone-acetaminophen  (NORCO/VICODIN) 5-325 MG per tablet 1 tablet (has no administration in time range)  cefUROXime (CEFTIN) tablet 500 mg (has no administration in time range)  Tdap (BOOSTRIX) injection 0.5 mL (has no administration in time range)                                    Medical Decision Making 63 year old male here today with crush injury to left middle finger.  Plan-plain films do show tuft fracture.  Area irrigated, closed.  Please refer to procedure note.  Patient received tetanus shot.  Started on Ceftin.  Splinted, provided hand referral.  While this is technically an open fracture, it is our institutional practice to irrigate these in the ED and provide outpatient follow-up.  Considered trephination, however the patient's subungual hematoma was quite small and  did not believe is indicated.  Also considered whether narrow removal was indicated, however literature on removing fingernails remains mixed, and in this particular patient I do not believe it was warranted.  Discharged with hand follow-up.  Amount and/or Complexity of Data Reviewed Radiology: ordered.  Risk Prescription drug management.        Final diagnoses:  Crushing injury of finger, initial encounter  Laceration of left middle finger without foreign body with damage to nail, initial encounter    ED Discharge Orders     None          Mannie Fairy DASEN, DO 02/07/24 1908    Mannie Fairy T, DO 02/07/24 1915

## 2024-02-07 NOTE — ED Triage Notes (Signed)
 Pt POV reporting L middle finger injury, was moving logs and finger was crushed, obvious deformity noted, bleeding controlled.

## 2024-02-07 NOTE — Discharge Instructions (Addendum)
 You have a crush injury to your finger.  This means that you have a fracture of your fingertip.  While you were here, you received a tetanus shot.  You received an antibiotic called Ceftin.  Please take Ceftin once in the morning and once in the evening for the next 7 days, unless it is changed by the hand doctor.  You may also take Percocet as needed for severe pain.  Tomorrow, please call Dr. Murray office for an ED follow-up.  Regarding your sutures, tomorrow, you can gently wash area with soap and water.  The stitches will need to be removed within 5 to 7 days.

## 2024-02-09 ENCOUNTER — Other Ambulatory Visit: Payer: Self-pay | Admitting: Orthopedic Surgery

## 2024-02-09 ENCOUNTER — Encounter (HOSPITAL_BASED_OUTPATIENT_CLINIC_OR_DEPARTMENT_OTHER): Payer: Self-pay | Admitting: Orthopedic Surgery

## 2024-02-10 ENCOUNTER — Encounter (HOSPITAL_BASED_OUTPATIENT_CLINIC_OR_DEPARTMENT_OTHER)
Admission: RE | Admit: 2024-02-10 | Discharge: 2024-02-10 | Disposition: A | Discharge status: Home / Self Care | Source: Ambulatory Visit | Attending: Orthopedic Surgery | Admitting: Orthopedic Surgery

## 2024-02-10 DIAGNOSIS — I5022 Chronic systolic (congestive) heart failure: Secondary | ICD-10-CM | POA: Insufficient documentation

## 2024-02-10 LAB — BASIC METABOLIC PANEL WITH GFR
Anion gap: 9 (ref 5–15)
BUN: 12 mg/dL (ref 8–23)
CO2: 25 mmol/L (ref 22–32)
Calcium: 9.1 mg/dL (ref 8.9–10.3)
Chloride: 105 mmol/L (ref 98–111)
Creatinine, Ser: 1.11 mg/dL (ref 0.61–1.24)
GFR, Estimated: >60 mL/min (ref >60)
Glucose, Bld: 141 mg/dL — ABNORMAL HIGH (ref 70–99)
Potassium: 3.9 mmol/L (ref 3.5–5.1)
Sodium: 139 mmol/L (ref 135–145)

## 2024-02-10 NOTE — Progress Notes (Signed)

## 2024-02-13 ENCOUNTER — Ambulatory Visit (HOSPITAL_BASED_OUTPATIENT_CLINIC_OR_DEPARTMENT_OTHER)
Admission: RE | Admit: 2024-02-13 | Discharge: 2024-02-13 | Disposition: A | Discharge status: Home / Self Care | Attending: Orthopedic Surgery | Admitting: Orthopedic Surgery

## 2024-02-13 ENCOUNTER — Ambulatory Visit (HOSPITAL_BASED_OUTPATIENT_CLINIC_OR_DEPARTMENT_OTHER)

## 2024-02-13 ENCOUNTER — Encounter (HOSPITAL_BASED_OUTPATIENT_CLINIC_OR_DEPARTMENT_OTHER)
Admission: RE | Disposition: A | Discharge status: Home / Self Care | Payer: Self-pay | Source: Home / Self Care | Attending: Orthopedic Surgery

## 2024-02-13 ENCOUNTER — Encounter (HOSPITAL_BASED_OUTPATIENT_CLINIC_OR_DEPARTMENT_OTHER): Payer: Self-pay | Admitting: Orthopedic Surgery

## 2024-02-13 ENCOUNTER — Ambulatory Visit (HOSPITAL_BASED_OUTPATIENT_CLINIC_OR_DEPARTMENT_OTHER): Admitting: Anesthesiology

## 2024-02-13 ENCOUNTER — Other Ambulatory Visit: Payer: Self-pay

## 2024-02-13 DIAGNOSIS — S62632A Displaced fracture of distal phalanx of right middle finger, initial encounter for closed fracture: Secondary | ICD-10-CM

## 2024-02-13 DIAGNOSIS — S67193A Crushing injury of left middle finger, initial encounter: Secondary | ICD-10-CM | POA: Insufficient documentation

## 2024-02-13 DIAGNOSIS — Z79899 Other long term (current) drug therapy: Secondary | ICD-10-CM | POA: Diagnosis not present

## 2024-02-13 DIAGNOSIS — S62633B Displaced fracture of distal phalanx of left middle finger, initial encounter for open fracture: Secondary | ICD-10-CM | POA: Insufficient documentation

## 2024-02-13 DIAGNOSIS — K219 Gastro-esophageal reflux disease without esophagitis: Secondary | ICD-10-CM | POA: Insufficient documentation

## 2024-02-13 DIAGNOSIS — I10 Essential (primary) hypertension: Secondary | ICD-10-CM | POA: Diagnosis not present

## 2024-02-13 DIAGNOSIS — W230XXA Caught, crushed, jammed, or pinched between moving objects, initial encounter: Secondary | ICD-10-CM | POA: Diagnosis not present

## 2024-02-13 SURGERY — REPAIR, NAIL BED
Anesthesia: General | Site: Middle Finger | Laterality: Left

## 2024-02-13 MED ORDER — PROPOFOL 10 MG/ML IV BOLUS
INTRAVENOUS | Status: DC | PRN
  Administered 2024-02-13: 200 mg via INTRAVENOUS

## 2024-02-13 MED ORDER — MIDAZOLAM HCL 5 MG/5ML IJ SOLN
INTRAMUSCULAR | Status: DC | PRN
  Administered 2024-02-13: 2 mg via INTRAVENOUS

## 2024-02-13 MED ORDER — KETOROLAC TROMETHAMINE 30 MG/ML IJ SOLN
INTRAMUSCULAR | Status: AC
  Filled 2024-02-13: qty 1

## 2024-02-13 MED ORDER — LIDOCAINE HCL (CARDIAC) PF 100 MG/5ML IV SOSY
PREFILLED_SYRINGE | INTRAVENOUS | Status: DC | PRN
  Administered 2024-02-13: 50 mg via INTRAVENOUS

## 2024-02-13 MED ORDER — LACTATED RINGERS IV SOLN: INTRAVENOUS | Status: DC

## 2024-02-13 MED ORDER — KETOROLAC TROMETHAMINE 30 MG/ML IJ SOLN
INTRAMUSCULAR | Status: DC | PRN
  Administered 2024-02-13: 30 mg via INTRAVENOUS

## 2024-02-13 MED ORDER — MIDAZOLAM HCL 2 MG/2ML IJ SOLN
INTRAMUSCULAR | Status: AC
  Filled 2024-02-13: qty 2

## 2024-02-13 MED ORDER — DROPERIDOL 2.5 MG/ML IJ SOLN: 0.6250 mg | Freq: Once | INTRAMUSCULAR | Status: DC | PRN

## 2024-02-13 MED ORDER — OXYCODONE HCL 5 MG PO TABS: 5.0000 mg | ORAL_TABLET | Freq: Once | ORAL | Status: DC | PRN

## 2024-02-13 MED ORDER — 0.9 % SODIUM CHLORIDE (POUR BTL) OPTIME
TOPICAL | Status: DC | PRN
  Administered 2024-02-13: 200 mL

## 2024-02-13 MED ORDER — DEXAMETHASONE SODIUM PHOSPHATE 10 MG/ML IJ SOLN
INTRAMUSCULAR | Status: AC
  Filled 2024-02-13: qty 1

## 2024-02-13 MED ORDER — FENTANYL CITRATE (PF) 100 MCG/2ML IJ SOLN
INTRAMUSCULAR | Status: AC
  Filled 2024-02-13: qty 2

## 2024-02-13 MED ORDER — FENTANYL CITRATE (PF) 100 MCG/2ML IJ SOLN
INTRAMUSCULAR | Status: DC | PRN
  Administered 2024-02-13 (×2): 50 ug via INTRAVENOUS

## 2024-02-13 MED ORDER — ACETAMINOPHEN 10 MG/ML IV SOLN: 1000.0000 mg | Freq: Once | INTRAVENOUS | Status: DC | PRN

## 2024-02-13 MED ORDER — BUPIVACAINE HCL (PF) 0.25 % IJ SOLN
INTRAMUSCULAR | Status: DC | PRN
  Administered 2024-02-13: 9 mL

## 2024-02-13 MED ORDER — ONDANSETRON HCL 4 MG/2ML IJ SOLN
INTRAMUSCULAR | Status: DC | PRN
  Administered 2024-02-13: 4 mg via INTRAVENOUS

## 2024-02-13 MED ORDER — DEXAMETHASONE SODIUM PHOSPHATE 4 MG/ML IJ SOLN
INTRAMUSCULAR | Status: DC | PRN
  Administered 2024-02-13: 5 mg via INTRAVENOUS

## 2024-02-13 MED ORDER — HYDROCODONE-ACETAMINOPHEN 5-325 MG PO TABS: 1.0000 | ORAL_TABLET | Freq: Four times a day (QID) | ORAL | 0 refills | Status: AC | PRN

## 2024-02-13 MED ORDER — LIDOCAINE 2% (20 MG/ML) 5 ML SYRINGE
INTRAMUSCULAR | Status: AC
  Filled 2024-02-13: qty 5

## 2024-02-13 MED ORDER — ONDANSETRON HCL 4 MG/2ML IJ SOLN
INTRAMUSCULAR | Status: AC
  Filled 2024-02-13: qty 2

## 2024-02-13 MED ORDER — LIDOCAINE 2% (20 MG/ML) 5 ML SYRINGE
INTRAMUSCULAR | Status: AC
Start: 2024-02-13 — End: 2024-02-13
  Filled 2024-02-13: qty 5

## 2024-02-13 MED ORDER — FENTANYL CITRATE (PF) 100 MCG/2ML IJ SOLN: 25.0000 ug | INTRAMUSCULAR | Status: DC | PRN

## 2024-02-13 MED ORDER — OXYCODONE HCL 5 MG/5ML PO SOLN: 5.0000 mg | Freq: Once | ORAL | Status: DC | PRN

## 2024-02-13 SURGICAL SUPPLY — 54 items
BLADE MINI RND TIP GREEN BEAV (BLADE) ×1 IMPLANT
BLADE SURG 15 STRL LF DISP TIS (BLADE) ×2 IMPLANT
BNDG COHESIVE 1X5 TAN STRL LF (GAUZE/BANDAGES/DRESSINGS) ×1 IMPLANT
BNDG COMPR ESMARK 4X3 LF (GAUZE/BANDAGES/DRESSINGS) ×1 IMPLANT
BNDG ELASTIC 2INX 5YD STR LF (GAUZE/BANDAGES/DRESSINGS) IMPLANT
BNDG ELASTIC 3INX 5YD STR LF (GAUZE/BANDAGES/DRESSINGS) IMPLANT
BNDG GAUZE DERMACEA FLUFF 4 (GAUZE/BANDAGES/DRESSINGS) ×1 IMPLANT
CHLORAPREP W/TINT 26 (MISCELLANEOUS) ×1 IMPLANT
CORD BIPOLAR FORCEPS 12FT (ELECTRODE) ×1 IMPLANT
COVER BACK TABLE 60X90IN (DRAPES) ×1 IMPLANT
COVER MAYO STAND STRL (DRAPES) ×1 IMPLANT
CUFF TOURN SGL QUICK 18X4 (TOURNIQUET CUFF) ×1 IMPLANT
DRAPE EXTREMITY T 121X128X90 (DISPOSABLE) ×1 IMPLANT
DRAPE OEC MINIVIEW 54X84 (DRAPES) ×1 IMPLANT
DRAPE SURG 17X23 STRL (DRAPES) ×1 IMPLANT
GAUZE SPONGE 4X4 12PLY STRL (GAUZE/BANDAGES/DRESSINGS) ×1 IMPLANT
GAUZE STRETCH 2X75IN STRL (MISCELLANEOUS) IMPLANT
GAUZE XEROFORM 1X8 LF (GAUZE/BANDAGES/DRESSINGS) ×1 IMPLANT
GLOVE BIO SURGEON STRL SZ7 (GLOVE) IMPLANT
GLOVE BIO SURGEON STRL SZ7.5 (GLOVE) ×1 IMPLANT
GLOVE BIOGEL PI IND STRL 7.0 (GLOVE) IMPLANT
GLOVE BIOGEL PI IND STRL 8 (GLOVE) ×1 IMPLANT
GLOVE BIOGEL PI IND STRL 8.5 (GLOVE) IMPLANT
GLOVE SURG ORTHO 8.0 STRL STRW (GLOVE) IMPLANT
GLOVE SURG SS PI 7.0 STRL IVOR (GLOVE) IMPLANT
GOWN STRL REUS W/ TWL LRG LVL3 (GOWN DISPOSABLE) ×1 IMPLANT
GOWN STRL REUS W/ TWL XL LVL3 (GOWN DISPOSABLE) ×1 IMPLANT
GOWN STRL REUS W/TWL XL LVL3 (GOWN DISPOSABLE) ×1 IMPLANT
NDL HYPO 25X1 1.5 SAFETY (NEEDLE) IMPLANT
NEEDLE HYPO 25X1 1.5 SAFETY (NEEDLE) ×1 IMPLANT
NS IRRIG 1000ML POUR BTL (IV SOLUTION) ×1 IMPLANT
PACK BASIN DAY SURGERY FS (CUSTOM PROCEDURE TRAY) ×1 IMPLANT
PAD CAST 3X4 CTTN HI CHSV (CAST SUPPLIES) IMPLANT
PAD CAST 4YDX4 CTTN HI CHSV (CAST SUPPLIES) IMPLANT
PADDING CAST ABS COTTON 4X4 ST (CAST SUPPLIES) ×1 IMPLANT
SLEEVE SCD COMPRESS KNEE MED (STOCKING) IMPLANT
SPLINT PLASTER CAST XFAST 3X15 (CAST SUPPLIES) IMPLANT
SPLINT PLASTER CAST XFAST 4X15 (CAST SUPPLIES) IMPLANT
STOCKINETTE 4X48 STRL (DRAPES) ×1 IMPLANT
SUT CHROMIC 5 0 P 3 (SUTURE) IMPLANT
SUT CHROMIC 6 0 G 1 (SUTURE) IMPLANT
SUT CHROMIC 6 0 PS 4 (SUTURE) IMPLANT
SUT ETHILON 3 0 PS 1 (SUTURE) IMPLANT
SUT ETHILON 4 0 PS 2 18 (SUTURE) ×1 IMPLANT
SUT MERSILENE 4 0 P 3 (SUTURE) IMPLANT
SUT MON AB 5-0 P3 18 (SUTURE) IMPLANT
SUT VIC AB 3-0 PS1 18XBRD (SUTURE) IMPLANT
SUT VIC AB 4-0 PS2 18 (SUTURE) IMPLANT
SUT VIC AB 5-0 P-3 18X BRD (SUTURE) IMPLANT
SYR BULB EAR ULCER 3OZ GRN STR (SYRINGE) ×1 IMPLANT
SYR CONTROL 10ML LL (SYRINGE) IMPLANT
TOWEL GREEN STERILE FF (TOWEL DISPOSABLE) ×2 IMPLANT
TRAY DSU PREP LF (CUSTOM PROCEDURE TRAY) IMPLANT
UNDERPAD 30X36 HEAVY ABSORB (UNDERPADS AND DIAPERS) ×1 IMPLANT

## 2024-02-13 NOTE — Op Note (Signed)
 SURGERY ASSISTANT NOTE  PLACE OF SERVICE: [facility]   PATIENT INFORMATION: Name: Ruben Simmons MRN#: [id number] DOB: [birth year]  Date of surgery: 02/13/2024 Time of surgery: 2:33 PM  SURGERY ASSISTANT NOTE:  Assistant name: Isaiah Anton, PA-C Note date: 02/13/2024  I assisted Dr. Franky Curia on the following procedure(s) for the above-noted patient in the date and time documented:   Left long finger irrigation and debridement of open distal phalanx fracture  Left long finger open reduction of open distal phalanx fracture   Left long finger repair of skin and nailbed lacerations    I provided assistance on the case as follows:  Assistance with exposure, retraction, bleeding control, protection of vital structures, instrumentation  and closure.   Isaiah Anton, PA-C

## 2024-02-13 NOTE — H&P (Signed)
 Ruben Simmons is an 63 y.o. male.   Chief Complaint: left long finger crush injury HPI: 63 yo male states he smashed left long finger between logs 02/07/24.  Seen at ED where XR revealed distal phalanx fracture.  Wound sutured.  Followed up in office.  He wishes to proceed with surgical treatment.  Allergies:  Allergies  Allergen Reactions   Dexamethasone Nausea Only and Other (See Comments)   Penicillin G Other (See Comments)    Patient states that he feels like he is on steroids and upsets his stomach   Penicillins     GI upset   Statins Other (See Comments)    other   Prednisone Anxiety    Past Medical History:  Diagnosis Date   GERD (gastroesophageal reflux disease)    Hypertension     History reviewed. No pertinent surgical history.  Family History: Family History  Problem Relation Age of Onset   Parkinson's disease Father     Social History:   reports that he has never smoked. He has never used smokeless tobacco. He reports current alcohol use. He reports that he does not currently use drugs.  Medications: Medications Prior to Admission  Medication Sig Dispense Refill   Alirocumab  (PRALUENT ) 75 MG/ML SOAJ Inject 1 mL (75 mg total) into the skin every 14 (fourteen) days. 2 mL 11   cefUROXime (CEFTIN) 500 MG tablet Take 1 tablet (500 mg total) by mouth 2 (two) times daily with a meal for 7 days. 14 tablet 0   esomeprazole (NEXIUM) 20 MG capsule Take 20 mg by mouth daily at 12 noon.     hydrochlorothiazide  (MICROZIDE ) 12.5 MG capsule TAKE 1 CAPSULE BY MOUTH EVERY DAY 90 capsule 3   losartan  (COZAAR ) 25 MG tablet TAKE 1 TABLET BY MOUTH EVERYDAY AT BEDTIME 90 tablet 3   losartan  (COZAAR ) 50 MG tablet TAKE 1 TABLET BY MOUTH EVERY MORNING 90 tablet 3   oxyCODONE-acetaminophen  (PERCOCET/ROXICET) 5-325 MG tablet Take 1 tablet by mouth every 6 (six) hours as needed for severe pain (pain score 7-10). 3 tablet 0   potassium chloride  (KLOR-CON ) 10 MEQ tablet TAKE 1 TABLET BY MOUTH  2 TIMES DAILY. 180 tablet 3    No results found for this or any previous visit (from the past 48 hours).  No results found.    Height 5' 11 (1.803 m), weight 103 kg.  General appearance: alert, cooperative, and appears stated age Head: Normocephalic, without obvious abnormality, atraumatic Neck: supple, symmetrical, trachea midline Extremities: Intact sensation and capillary refill all digits.  +epl/fpl/io.  Laceration on radial pad of digits.  Nail avulsed from nail fold. Skin: Skin color, texture, turgor normal. No rashes or lesions Neurologic: Grossly normal Incision/Wound: as above  Assessment/Plan Left long finger crush injury.  Plan irrigation and debridement, reduction and possible pinning of fracture, repair skin and nail bed lacerations.  Non operative and operative treatment options have been discussed with the patient and patient wishes to proceed with operative treatment. Risks, benefits, and alternatives of surgery have been discussed and the patient agrees with the plan of care.   Ruben Simmons 02/13/2024, 12:20 PM

## 2024-02-13 NOTE — Op Note (Signed)
 NAME: AZEKIEL CREMER MEDICAL RECORD NO: [id number] DATE OF BIRTH: Mar 14, 1961 FACILITY: [facility] LOCATION: [facility] PHYSICIAN: Staci Carver R. Pierson Vantol, MD   OPERATIVE REPORT   DATE OF PROCEDURE: 02/13/24    PREOPERATIVE DIAGNOSIS: Left long finger open distal phalanx fracture with skin and nailbed laceration   POSTOPERATIVE DIAGNOSIS: Left long finger open distal phalanx fracture with skin and nailbed laceration   PROCEDURE: 1.  Left long finger irrigation and debridement of open distal phalanx fracture 2.  Left long finger open reduction open distal phalanx fracture 3.  Left long finger repair of skin and nailbed lacerations   SURGEON:  Franky Curia, M.D.   ASSISTANT: Isaiah Anton, [facility]   ANESTHESIA:  General   INTRAVENOUS FLUIDS:  Per anesthesia flow sheet.   ESTIMATED BLOOD LOSS:  Minimal.   COMPLICATIONS:  None.   SPECIMENS:  none   TOURNIQUET TIME:    Total Tourniquet Time Documented: Upper Arm (Left) - 21 minutes Total: Upper Arm (Left) - 21 minutes    DISPOSITION:  Stable to PACU.   INDICATIONS: 63 year old male states he smashed his left long finger between 2 logs 5 days ago.  Seen at the emergency department where the wound was irrigated and closed.  Follow-up in the office.  He wishes to proceed with operative irrigation debridement with reduction possible pinning and repair of skin and nailbed laceration.  Risks, benefits and alternatives of surgery were discussed including the risks of blood loss, infection, damage to nerves, vessels, tendons, ligaments, bone for surgery, need for additional surgery, complications with wound healing, continued pain, stiffness, nail deformity.  He voiced understanding of these risks and elected to proceed.  OPERATIVE COURSE:  After being identified preoperatively by myself,  the patient and I agreed on the procedure and site of the procedure.  The surgical site was marked.  Surgical consent had been signed.  He is on  oral antibiotics.  He was transferred to the operating room and placed on the operating table in supine position with the left upper extremity on an arm board.  General anesthesia was induced by the anesthesiologist.  Left upper extremity was prepped and draped in normal sterile orthopedic fashion.  A surgical pause was performed between the surgeons, anesthesia, and operating room staff and all were in agreement as to the patient, procedure, and site of procedure.  Tourniquet at the proximal aspect of the extremity was inflated to 250 mmHg after exsanguination of the arm with an Esmarch bandage.  The sutures were removed.  The nail was removed with a freer elevator.  There was laceration around the radial side of the distal phalanx into the proximal aspect of the nailbed.  The wound was explored.  The radial digital nerve was intact.  There was distal phalanx fracture at the tuft with a fragment of comminution.  The wound and fracture site were debrided of devitalized skin subcutaneous tissues and clot in the fracture site.  This was done with the pickups and knife to remove devitalized tissues.  The wound and fracture site were then copiously irrigated with sterile saline.  The fracture was reduced under direct visualization.  5-0 Monocryl suture was used to reapproximate skin edges.  6-0 chromic suture was used in the nailbed to reapproximate the nailbed.  Good reapproximation was obtained.  A piece of Xeroform was placed in the nail fold and the wound dressed with sterile Xeroform and 4 x 4 and wrapped with a Coban dressing lightly.  AlumaFoam splint  was placed and wrapped lightly with Coban dressing.  Digital block was performed with quarter percent plain Marcaine to aid in postoperative analgesia.  The tourniquet was deflated at 21 minutes.  Fingertips were pink with brisk capillary refill after deflation of tourniquet.  The operative  drapes were broken down.  The patient was awoken from anesthesia safely.   He was transferred back to the stretcher and taken to PACU in stable condition.  I will see him back in the office in 1 week for postoperative followup.  I will give him a prescription for Norco 5/325 1 tab PO q6 hours prn pain, dispense #15.   Tiandre Teall, MD Electronically signed, 02/13/24

## 2024-02-13 NOTE — Anesthesia Postprocedure Evaluation (Signed)
 Anesthesia Post Note  Patient: NIKKOLAS COOMES  Procedure(s) Performed: LEFT LONG FINGER NAIL BED REPAIR (Left: Middle Finger) IRRIGATION AND DEBRIDEMENT, OPEN FRACTURE (Left: Middle Finger) OPEN REDUCTION INTERNAL FIXATION (ORIF) DISTAL PHALANX (Left: Middle Finger)     Patient location during evaluation: PACU Anesthesia Type: General Level of consciousness: awake and alert Pain management: pain level controlled Vital Signs Assessment: post-procedure vital signs reviewed and stable Respiratory status: spontaneous breathing, nonlabored ventilation, respiratory function stable and patient connected to nasal cannula oxygen Cardiovascular status: blood pressure returned to baseline and stable Postop Assessment: no apparent nausea or vomiting Anesthetic complications: no   No notable events documented.  Last Vitals:  Vitals:   02/13/24 1445 02/13/24 1500  BP: (!) 151/103 (!) 149/89  Pulse: 83 74  Resp: 16 18  Temp:    SpO2: 99% 95%    Last Pain:  Vitals:   02/13/24 1500  PainSc: 0-No pain                 Franky JONETTA Bald

## 2024-02-13 NOTE — Transfer of Care (Signed)
 Immediate Anesthesia Transfer of Care Note  Patient: Ruben Simmons  Procedure(s) Performed: LEFT LONG FINGER NAIL BED REPAIR (Left: Middle Finger) IRRIGATION AND DEBRIDEMENT, OPEN FRACTURE (Left: Middle Finger)  Patient Location: PACU  Anesthesia Type:General  Level of Consciousness: awake, alert , and oriented  Airway & Oxygen Therapy: Patient Spontanous Breathing and Patient connected to face mask oxygen  Post-op Assessment: Report given to RN and Post -op Vital signs reviewed and stable  Post vital signs: Reviewed and stable  Last Vitals:  Vitals Value Taken Time  BP 154/103 02/13/24 14:41  Temp    Pulse 88 02/13/24 14:42  Resp 18 02/13/24 14:42  SpO2 99 % 02/13/24 14:42  Vitals shown include unfiled device data.  Last Pain:  Vitals:   02/13/24 1222  PainSc: 0-No pain         Complications: No notable events documented.

## 2024-02-13 NOTE — Discharge Instructions (Addendum)
  No ibuprofen  until 8:30 p.m.  [facility] Instructions Hand Surgery  Wound Care: Keep your hand elevated above the level of your heart.  Do not allow it to dangle by your side.  Keep the dressing dry and do not remove it unless your doctor advises you to do so.  He will usually change it at the time of your post-op visit.  Moving your fingers is advised to stimulate circulation but will depend on the site of your surgery.  If you have a splint applied, your doctor will advise you regarding movement.  Activity: Do not drive or operate machinery today.  Rest today and then you may return to your normal activity and work as indicated by your physician.  Diet:  Drink liquids today or eat a light diet.  You may resume a regular diet tomorrow.    General expectations: Pain for two to three days. Fingers may become slightly swollen.  Call your doctor if any of the following occur: Severe pain not relieved by pain medication. Elevated temperature. Dressing soaked with blood. Inability to move fingers. White or bluish color to fingers.    Post Anesthesia Home Care Instructions  Activity: Get plenty of rest for the remainder of the day. A responsible individual must stay with you for 24 hours following the procedure.  For the next 24 hours, DO NOT: -Drive a car -Advertising copywriter -Drink alcoholic beverages -Take any medication unless instructed by your physician -Make any legal decisions or sign important papers.  Meals: Start with liquid foods such as gelatin or soup. Progress to regular foods as tolerated. Avoid greasy, spicy, heavy foods. If nausea and/or vomiting occur, drink only clear liquids until the nausea and/or vomiting subsides. Call your physician if vomiting continues.  Special Instructions/Symptoms: Your throat may feel dry or sore from the anesthesia or the breathing tube placed in your throat during surgery. If this causes discomfort, gargle with warm salt water. The  discomfort should disappear within 24 hours.  If you had a scopolamine patch placed behind your ear for the management of post- operative nausea and/or vomiting:  1. The medication in the patch is effective for 72 hours, after which it should be removed.  Wrap patch in a tissue and discard in the trash. Wash hands thoroughly with soap and water. 2. You may remove the patch earlier than 72 hours if you experience unpleasant side effects which may include dry mouth, dizziness or visual disturbances. 3. Avoid touching the patch. Wash your hands with soap and water after contact with the patch.

## 2024-02-13 NOTE — Anesthesia Procedure Notes (Signed)
 Procedure Name: LMA Insertion Date/Time: 02/13/2024 1:51 PM  Performed by: Buster Catheryn SAUNDERS, CRNAPre-anesthesia Checklist: Patient identified, Emergency Drugs available, Suction available and Patient being monitored Patient Re-evaluated:Patient Re-evaluated prior to induction Oxygen Delivery Method: Circle system utilized Preoxygenation: Pre-oxygenation with 100% oxygen Induction Type: IV induction Ventilation: Mask ventilation without difficulty LMA: LMA inserted LMA Size: 5.0 Number of attempts: 1 Placement Confirmation: positive ETCO2 Tube secured with: Tape Dental Injury: Teeth and Oropharynx as per pre-operative assessment

## 2024-02-13 NOTE — Anesthesia Preprocedure Evaluation (Addendum)
 Anesthesia Evaluation  Patient identified by MRN, date of birth, ID band Patient awake    Reviewed: Allergy & Precautions, NPO status , Patient's Chart, lab work & pertinent test results  Airway Mallampati: I  TM Distance: >3 FB Neck ROM: Full    Dental  (+) Teeth Intact, Dental Advisory Given   Pulmonary neg pulmonary ROS   breath sounds clear to auscultation       Cardiovascular hypertension, Pt. on medications  Rhythm:Regular Rate:Normal     Neuro/Psych negative neurological ROS  negative psych ROS   GI/Hepatic Neg liver ROS,GERD  Medicated,,  Endo/Other  negative endocrine ROS    Renal/GU negative Renal ROS     Musculoskeletal negative musculoskeletal ROS (+)    Abdominal   Peds  Hematology negative hematology ROS (+)   Anesthesia Other Findings   Reproductive/Obstetrics                              Anesthesia Physical Anesthesia Plan  ASA: 2  Anesthesia Plan: General   Post-op Pain Management: Minimal or no pain anticipated   Induction: Intravenous  PONV Risk Score and Plan: 3 and Ondansetron, Dexamethasone and Midazolam  Airway Management Planned: LMA  Additional Equipment: None  Intra-op Plan:   Post-operative Plan: Extubation in OR  Informed Consent: I have reviewed the patients History and Physical, chart, labs and discussed the procedure including the risks, benefits and alternatives for the proposed anesthesia with the patient or authorized representative who has indicated his/her understanding and acceptance.     Dental advisory given  Plan Discussed with: CRNA  Anesthesia Plan Comments:          Anesthesia Quick Evaluation

## 2024-02-14 ENCOUNTER — Encounter (HOSPITAL_BASED_OUTPATIENT_CLINIC_OR_DEPARTMENT_OTHER): Payer: Self-pay | Admitting: Orthopedic Surgery

## 2024-03-12 ENCOUNTER — Encounter: Payer: Self-pay | Admitting: Radiology

## 2024-03-22 NOTE — Discharge Summary (Signed)
 Hospitalist Service Discharge Summary   Name Ruben Simmons   Date of Birth November 22, 1960 (Age: 63 y.o.)  MRN [id number]  Date of Admission  03/21/2024  Date of Discharge  03/22/2024  Admitting Physician Charlie KATHEE Finger, DO  Admission Diagnosis Acute CVA (cerebrovascular accident) (CMS-HCC) [I63.9]  Discharge Physician Alm KATHEE Chihuahua, MD  Primary Discharge Diagnosis Strokelike symptoms, TIA/CVA less likely  Secondary Discharge Diagnoses Hypertension  Disposition home  PCP Dr, No Pcp/Family    Hospital Problem List   Principal Problem:   Acute CVA (cerebrovascular accident) (CMS-HCC) Active Problems:   Stroke-like symptom Resolved Problems:   * No resolved hospital problems. *    Reason for Admission  Per Admission H&P: Ruben Simmons is a 63 y.o. male with a past medical history significant for hypertension and hyperlipidemia who presents to the ED with complaints of elevated blood pressure readings and left facial numbness.  Over the past day or so patient has noted elevated blood pressure readings, occasionally >150 systolic.  He has attempted to take several extra doses of his losartan  without adequate improvement.  While driving to the area today around 3 PM, he began having some left facial numbness and tingling.  Ultimately this prompted his presentation in the emergency department.  He denies any focal numbness, tingling, or weakness elsewhere.  He denies any difficulty with vision, hearing changes, or difficulty swallowing.  At time of evaluation, his symptoms are persistent although somewhat improving.  He has no other new acute complaints to me.     In the emergency department, patient arrived hypertensive however otherwise hemodynamically stable.  Laboratory workup was largely reassuring.  EKG showed sinus rhythm.  CT of the head showed no acute intracranial abnormality.  CT angiogram of the head and neck showed no occlusion or stenosis of the major cervical arteries or  major intracranial arteries.  Patient received 325 mg aspirin in the ED.  Admission was then requested.    HOSPITAL COURSE   Additional details by problem below:  # Strokelike episode #Left facial numbness > CT head, CTA head and neck no acute intracranial normality > MRI brain unremarkable > Neurology consulted, symptoms not consistent with TIA or CVA, no further workup recommended  #History hypertension Patient states that his hypertension was worse than usual the last few days but that it is usually well-controlled, after discussion today he prefers to follow-up with primary care physician for further medication titration   Wound Care    N/a  Consultants   Neurology    Discharge Exam and Labs   Discharge Exam: BP (!) 147/85 (BP Location: Left arm, Patient Position: Sitting)   Pulse 78   Temp 98.1 F (36.7 C)   Resp 20   Ht 1.803 m (5' 10.98)   Wt 104.3 kg (230 lb)   SpO2 91%   BMI 32.09 kg/m  General: Caucasian man no acute distress Cardio: RRR, normal s1, s2, no m/r/g Resp: CTAB/L, no wheezes, rhonchi or rales heard, even breathing, not labored Abdomen: soft, NT, ND, BS+, no organomegaly noted or masses Ext: well perfused, +2 pulses, no edema or cyanosis Neuro: grossly intact  Discharge/Recent Laboratory Results: Recent Labs    03/22/24 0137 03/21/24 1656  WBC 9.7 8.0  HGB 14.4 15.6  HCT 41.9 45.1  PLT 227 244  MCV 90.1 91.1  RDW 12.3 12.2    Recent Labs    03/21/24 1656  PROTIME 10.7  INR 1.0  PTT 27.4   Recent Labs  03/21/24 2215 03/21/24 1659 03/21/24 1656  GLU 161* 96 90   Recent Labs    03/21/24 2215 03/21/24 1701 03/21/24 1656  NA 143  --  142  K 3.3*  --  3.8  CL 102  --  102  CO2 30  --  29  BUN 15  --  12  CREATININE 1.11 1.20 1.13  CA 8.6  --  9.4   Recent Labs    03/21/24 1656  PROT 7.8  ALBUMIN 4.5  GLO 3.3  BILITOT 0.5  AST 23  ALT 30  ALKPHOS 74   No results for input(s): BACTERIA, PHUR,  PROTEINU, RBCU, APPEARU, BILIRUBINUR, COLORFL, KETONESU, NITRITE, UROBILINOGEN, SPECGRAV, BLOODU, CULTURE, MUCUS, SQUAMUA, WBCU in the last 72 hours.  Invalid input(s): GLUCOSE No results found for: CULTURE No results for input(s): PHART, PO2ART, PCO2ART, HCO3, BEART in the last 72 hours.  Condition on Discharge: stable   Discharge Medications     Medication List     CONTINUE taking these medications    HYDROCHLOROTHIAZIDE  PO take 12.5 mg every day by mouth .   * losartan  50 mg Tab Commonly known as: COZAAR  take 50 mg every day by mouth .   * losartan  25 mg Tab Commonly known as: COZAAR  take 25 mg as needed by mouth .   NEXIUM PO take by mouth .   POTASSIUM CHLORIDE  10 mEq every day by Does not apply route .      * * This list has 2 medication(s) that are the same as other medications prescribed for you. Read the directions carefully, and ask your doctor or other care provider to review them with you.           Plan for After Discharge   - Follow-up with PCP in 1 week    Be sure to remember your upcoming appointments: No future appointments.  - Pending Test Results: n/a - Recommended Follow-up Tests: n/a   Time Spent on Discharge Care: 35 Minutes This includes physical examination, communication with the patient and/or family, coordination of care, documentation of the discharge summary and/or discharge instructions, and prescription preparation.  Signed: Alm KATHEE Chihuahua, MD 03/22/2024 2:11 PM  CC: Dr, No Pcp/Family  *Some images could not be shown.

## 2024-03-23 ENCOUNTER — Telehealth: Payer: Self-pay | Admitting: *Deleted

## 2024-03-23 NOTE — Transitions of Care (Post Inpatient/ED Visit) (Signed)
   03/23/2024  Name: Ruben Simmons MRN: [id number] DOB: 01-22-1961  Today's TOC FU Call Status: Today's TOC FU Call Status:: Unsuccessful Call (1st Attempt) Unsuccessful Call (1st Attempt) Date: 03/23/24  Attempted to reach the patient regarding the most recent Inpatient/ED visit.  Follow Up Plan: Additional outreach attempts will be made to reach the patient to complete the Transitions of Care (Post Inpatient/ED visit) call.  Cathlean Headland BSN RN [facility] Health Care Management Coordinator [email or web address] Direct Dial: [phone]  Fax: [phone] Website: [email or web address]

## 2024-05-16 ENCOUNTER — Encounter: Payer: Self-pay | Admitting: Internal Medicine

## 2024-07-17 ENCOUNTER — Ambulatory Visit: Admitting: Family Medicine
